# Patient Record
Sex: Female | Born: 1990 | Race: Black or African American | Hispanic: No | Marital: Single | Smoking: Current some day smoker
Health system: Southern US, Community
[De-identification: ages and names within clinical notes are randomized; demographics above are authoritative.]

## PROBLEM LIST (undated history)

## (undated) ENCOUNTER — Inpatient Hospital Stay (HOSPITAL_COMMUNITY): Payer: Self-pay

## (undated) DIAGNOSIS — F329 Major depressive disorder, single episode, unspecified: Secondary | ICD-10-CM

## (undated) DIAGNOSIS — F32A Depression, unspecified: Secondary | ICD-10-CM

## (undated) DIAGNOSIS — K219 Gastro-esophageal reflux disease without esophagitis: Secondary | ICD-10-CM

---

## 2018-01-16 ENCOUNTER — Other Ambulatory Visit: Payer: Self-pay

## 2018-01-16 ENCOUNTER — Encounter (HOSPITAL_COMMUNITY): Payer: Self-pay | Admitting: Emergency Medicine

## 2018-01-16 ENCOUNTER — Emergency Department (HOSPITAL_COMMUNITY)
Admission: EM | Admit: 2018-01-16 | Discharge: 2018-01-16 | Disposition: A | Discharge status: Home / Self Care | Payer: Medicaid Other | Attending: Emergency Medicine | Admitting: Emergency Medicine

## 2018-01-16 DIAGNOSIS — F17200 Nicotine dependence, unspecified, uncomplicated: Secondary | ICD-10-CM | POA: Diagnosis not present

## 2018-01-16 DIAGNOSIS — Z202 Contact with and (suspected) exposure to infections with a predominantly sexual mode of transmission: Secondary | ICD-10-CM | POA: Diagnosis not present

## 2018-01-16 DIAGNOSIS — N72 Inflammatory disease of cervix uteri: Secondary | ICD-10-CM | POA: Diagnosis not present

## 2018-01-16 DIAGNOSIS — N760 Acute vaginitis: Secondary | ICD-10-CM | POA: Diagnosis not present

## 2018-01-16 DIAGNOSIS — R300 Dysuria: Secondary | ICD-10-CM | POA: Diagnosis present

## 2018-01-16 DIAGNOSIS — B9689 Other specified bacterial agents as the cause of diseases classified elsewhere: Secondary | ICD-10-CM

## 2018-01-16 HISTORY — DX: Depression, unspecified: F32.A

## 2018-01-16 HISTORY — DX: Major depressive disorder, single episode, unspecified: F32.9

## 2018-01-16 HISTORY — DX: Gastro-esophageal reflux disease without esophagitis: K21.9

## 2018-01-16 LAB — CBC WITH DIFFERENTIAL/PLATELET
ABS IMMATURE GRANULOCYTES: 0.01 10*3/uL (ref 0.00–0.07)
Basophils Absolute: 0 10*3/uL (ref 0.0–0.1)
Basophils Relative: 0 %
Eosinophils Absolute: 0.2 10*3/uL (ref 0.0–0.5)
Eosinophils Relative: 3 %
HCT: 38 % (ref 36.0–46.0)
Hemoglobin: 11 g/dL — ABNORMAL LOW (ref 12.0–15.0)
Immature Granulocytes: 0 %
Lymphocytes Relative: 57 %
Lymphs Abs: 2.8 10*3/uL (ref 0.7–4.0)
MCH: 23.2 pg — ABNORMAL LOW (ref 26.0–34.0)
MCHC: 28.9 g/dL — ABNORMAL LOW (ref 30.0–36.0)
MCV: 80.2 fL (ref 80.0–100.0)
Monocytes Absolute: 0.3 10*3/uL (ref 0.1–1.0)
Monocytes Relative: 7 %
Neutro Abs: 1.6 10*3/uL — ABNORMAL LOW (ref 1.7–7.7)
Neutrophils Relative %: 33 %
Platelets: 206 10*3/uL (ref 150–400)
RBC: 4.74 MIL/uL (ref 3.87–5.11)
RDW: 13.2 % (ref 11.5–15.5)
WBC: 5 10*3/uL (ref 4.0–10.5)
nRBC: 0 % (ref 0.0–0.2)

## 2018-01-16 LAB — URINALYSIS, ROUTINE W REFLEX MICROSCOPIC
Bilirubin Urine: NEGATIVE
Glucose, UA: NEGATIVE mg/dL
Hgb urine dipstick: NEGATIVE
Ketones, ur: NEGATIVE mg/dL
Leukocytes, UA: NEGATIVE
Nitrite: NEGATIVE
Protein, ur: NEGATIVE mg/dL
Specific Gravity, Urine: 1.012 (ref 1.005–1.030)
pH: 7 (ref 5.0–8.0)

## 2018-01-16 LAB — COMPREHENSIVE METABOLIC PANEL
ALK PHOS: 33 U/L — AB (ref 38–126)
ALT: 11 U/L (ref 0–44)
AST: 15 U/L (ref 15–41)
Albumin: 3 g/dL — ABNORMAL LOW (ref 3.5–5.0)
Anion gap: 5 (ref 5–15)
CO2: 22 mmol/L (ref 22–32)
CREATININE: 0.67 mg/dL (ref 0.44–1.00)
Calcium: 8.5 mg/dL — ABNORMAL LOW (ref 8.9–10.3)
Chloride: 111 mmol/L (ref 98–111)
GFR calc Af Amer: >60 mL/min (ref >60)
GFR calc non Af Amer: >60 mL/min (ref >60)
Glucose, Bld: 83 mg/dL (ref 70–99)
Potassium: 3.8 mmol/L (ref 3.5–5.1)
Sodium: 138 mmol/L (ref 135–145)
Total Bilirubin: 0.3 mg/dL (ref 0.3–1.2)
Total Protein: 4.8 g/dL — ABNORMAL LOW (ref 6.5–8.1)

## 2018-01-16 LAB — I-STAT BETA HCG BLOOD, ED (MC, WL, AP ONLY)

## 2018-01-16 LAB — WET PREP, GENITAL
Sperm: NONE SEEN
Trich, Wet Prep: NONE SEEN
Yeast Wet Prep HPF POC: NONE SEEN

## 2018-01-16 MED ORDER — DOXYCYCLINE HYCLATE 100 MG PO CAPS: 100.0000 mg | ORAL_CAPSULE | Freq: Two times a day (BID) | ORAL | 0 refills | Status: AC

## 2018-01-16 MED ORDER — METRONIDAZOLE 500 MG PO TABS
2000.0000 mg | ORAL_TABLET | Freq: Once | ORAL | Status: AC
  Administered 2018-01-16: 2000 mg via ORAL
  Filled 2018-01-16: qty 4

## 2018-01-16 MED ORDER — AZITHROMYCIN 250 MG PO TABS
2000.0000 mg | ORAL_TABLET | Freq: Once | ORAL | Status: AC
  Administered 2018-01-16: 2000 mg via ORAL
  Filled 2018-01-16: qty 8

## 2018-01-16 MED ORDER — LIDOCAINE HCL (PF) 1 % IJ SOLN
INTRAMUSCULAR | Status: AC
  Administered 2018-01-16: 1 mL
  Filled 2018-01-16: qty 5

## 2018-01-16 MED ORDER — CEFTRIAXONE SODIUM 250 MG IJ SOLR
250.0000 mg | Freq: Once | INTRAMUSCULAR | Status: AC
  Administered 2018-01-16: 250 mg via INTRAMUSCULAR
  Filled 2018-01-16: qty 250

## 2018-01-16 NOTE — ED Provider Notes (Signed)
[facility] EMERGENCY DEPARTMENT Provider Note   CSN: [id number] Arrival date & time: 01/16/18  2024     History   Chief Complaint Chief Complaint  Patient presents with  . SEXUALLY TRANSMITTED DISEASE    HPI Lynn Rocha is a 28 y.o. female.  HPI   Patient is a 28 year old female with a past medical history of reflux and depression who presents for evaluation of 1 week of dysuria associated with crampy left lower quadrant abdominal pain.  Patient notes she was informed by her partner approximately 2 weeks ago that she was exposed through him to chlamydia.  She states she is also had some nonbloody diarrhea but denies any headache, earache, sore throat, chest pain, cough, vomiting, abdominal pain anywhere other than her left lower quadrant, rash, focal extremity numbness pain or tingling, or other acute complaints.  She denies prior similar episodes.  Denies alleviating or aggravating factors.  Past Medical History:  Diagnosis Date  . Acid reflux   . Depression     There are no active problems to display for this patient.   History reviewed. No pertinent surgical history.   OB History   No obstetric history on file.      Home Medications    Prior to Admission medications   Medication Sig Start Date End Date Taking? Authorizing Provider  doxycycline (VIBRAMYCIN) 100 MG capsule Take 1 capsule (100 mg total) by mouth 2 (two) times daily for 14 days. 01/16/18 01/30/18  Antoine Primas, MD    Family History No family history on file.  Social History Social History   Tobacco Use  . Smoking status: Current Every Day Smoker    Packs/day: 0.25  . Smokeless tobacco: Never Used  Substance Use Topics  . Alcohol use: Yes    Comment: occasionally  . Drug use: Never     Allergies   Patient has no known allergies.   Review of Systems Review of Systems  Constitutional: Negative for chills and fever.  HENT: Negative for ear pain and sore throat.     Eyes: Negative for pain and visual disturbance.  Respiratory: Negative for cough and shortness of breath.   Cardiovascular: Negative for chest pain and palpitations.  Gastrointestinal: Positive for abdominal pain and diarrhea. Negative for vomiting.  Genitourinary: Positive for dysuria. Negative for hematuria.  Musculoskeletal: Negative for arthralgias and back pain.  Skin: Negative for color change and rash.  Neurological: Negative for seizures and syncope.  All other systems reviewed and are negative.    Physical Exam Updated Vital Signs BP 109/79   Pulse 67   Temp 98.4 F (36.9 C) (Oral)   Resp 18   LMP 01/05/2018   SpO2 100%   Physical Exam Vitals signs and nursing note reviewed. Exam conducted with a chaperone present.  Constitutional:      General: She is not in acute distress.    Appearance: She is well-developed.  HENT:     Head: Normocephalic and atraumatic.     Right Ear: External ear normal.     Left Ear: External ear normal.     Nose: Nose normal.     Mouth/Throat:     Mouth: Mucous membranes are moist.  Eyes:     Extraocular Movements: Extraocular movements intact.     Conjunctiva/sclera: Conjunctivae normal.     Pupils: Pupils are equal, round, and reactive to light.  Neck:     Musculoskeletal: Neck supple.  Cardiovascular:     Rate and Rhythm:  Normal rate and regular rhythm.     Pulses: Normal pulses.     Heart sounds: No murmur.  Pulmonary:     Effort: Pulmonary effort is normal. No respiratory distress.     Breath sounds: Normal breath sounds.  Abdominal:     Palpations: Abdomen is soft.     Tenderness: There is abdominal tenderness in the left lower quadrant. There is no right CVA tenderness or left CVA tenderness.  Genitourinary:    Cervix: Discharge and erythema present. No cervical motion tenderness or cervical bleeding.     Adnexa:        Right: No mass, tenderness or fullness.         Left: No mass, tenderness or fullness.    Skin:     General: Skin is warm and dry.     Capillary Refill: Capillary refill takes less than 2 seconds.  Neurological:     General: No focal deficit present.     Mental Status: She is alert.      ED Treatments / Results  Labs (all labs ordered are listed, but only abnormal results are displayed) Labs Reviewed  WET PREP, GENITAL - Abnormal; Notable for the following components:      Result Value   Clue Cells Wet Prep HPF POC PRESENT (*)    WBC, Wet Prep HPF POC MANY (*)    All other components within normal limits  CBC WITH DIFFERENTIAL/PLATELET - Abnormal; Notable for the following components:   Hemoglobin 11.0 (*)    MCH 23.2 (*)    MCHC 28.9 (*)    Neutro Abs 1.6 (*)    All other components within normal limits  COMPREHENSIVE METABOLIC PANEL - Abnormal; Notable for the following components:   BUN <5 (*)    Calcium 8.5 (*)    Total Protein 4.8 (*)    Albumin 3.0 (*)    Alkaline Phosphatase 33 (*)    All other components within normal limits  URINE CULTURE  URINALYSIS, ROUTINE W REFLEX MICROSCOPIC  HIV ANTIBODY (ROUTINE TESTING W REFLEX)  HEPATITIS PANEL, ACUTE  I-STAT BETA HCG BLOOD, ED ([facility], [facility], [facility] ONLY)  GC/CHLAMYDIA PROBE AMP ([place]) NOT AT [facility]    EKG None  Radiology No results found.  Procedures Procedures (including critical care time)  Medications Ordered in ED Medications  cefTRIAXone (ROCEPHIN) injection 250 mg (250 mg Intramuscular Given 01/16/18 2117)  azithromycin (ZITHROMAX) tablet 2,000 mg (2,000 mg Oral Given 01/16/18 2117)  lidocaine (PF) (XYLOCAINE) 1 % injection (1 mL  Given 01/16/18 2117)  metroNIDAZOLE (FLAGYL) tablet 2,000 mg (2,000 mg Oral Given 01/16/18 2223)     Initial Impression / Assessment and Plan / ED Course  I have reviewed the triage vital signs and the nursing notes.  Pertinent labs & imaging results that were available during my care of the patient were reviewed by me and considered in my medical decision making (see chart for  details).     Patient is a 28 year old female who presents with above-stated history exam.  On presentation patient is afebrile stable vital signs.  Exam as above remarkable for left lower quadrant Donnell tenderness as well as cervical erythema with purulent discharge.  Patient has no adnexal tenderness on pelvic exam.  Patient was empirically treated with Rocephin and azithromycin.  Wet prep shows clue cells with many WBCs.  Patient empirically treated with Flagyl.  CMP shows normal electrolytes, unremarkable LFTs, AG of 5.  CBC shows a WBC of 5, hemoglobin  11, platelets 206.  Beta hCG undetectable.  Patient elected to leave prior to results of UA.  Hepatitis and HIV studies were sent.  Urine culture was sent.  Patient discharged with a prescription for doxycycline.  History exam is not consistent with appendicitis, TOA, diverticulitis, ovarian torsion, acute pancreatitis, or other life-threatening intra-abdominal pathology.  Patient discharged in stable condition.  Strict return precautions advised and discussed.  Instructed to follow-up with PCP in 3 to 5 days.  Final Clinical Impressions(s) / ED Diagnoses   Final diagnoses:  STD exposure  Cervicitis  Bacterial vaginosis    ED Discharge Orders         Ordered    doxycycline (VIBRAMYCIN) 100 MG capsule  2 times daily     01/16/18 2236           Antoine PrimasSmith, Lofton Leon, MD 01/16/18 2302    Jacalyn LefevreHaviland, Julie, MD 01/19/18 [phone]

## 2018-01-16 NOTE — ED Triage Notes (Addendum)
Pt reports a partner informed her that they were positive for gonorrhea and chlamydia. Pt reports burning with urination and lower abdominal pain x 1 week. Pt denies vaginal discharge or bleeding

## 2018-01-17 LAB — HIV ANTIBODY (ROUTINE TESTING W REFLEX): HIV Screen 4th Generation wRfx: NONREACTIVE

## 2018-01-18 LAB — HEPATITIS PANEL, ACUTE
HCV Ab: <0.1 s/co ratio (ref 0.0–0.9)
Hep A IgM: NEGATIVE
Hep B C IgM: NEGATIVE
Hepatitis B Surface Ag: NEGATIVE

## 2018-01-18 LAB — URINE CULTURE

## 2018-01-18 LAB — GC/CHLAMYDIA PROBE AMP (~~LOC~~) NOT AT ARMC
Chlamydia: POSITIVE — AB
Neisseria Gonorrhea: POSITIVE — AB

## 2018-04-10 ENCOUNTER — Inpatient Hospital Stay (HOSPITAL_COMMUNITY)
Admission: EM | Admit: 2018-04-10 | Discharge: 2018-04-10 | Disposition: A | Discharge status: Home / Self Care | Payer: Medicaid Other | Attending: Obstetrics & Gynecology | Admitting: Obstetrics & Gynecology

## 2018-04-10 ENCOUNTER — Other Ambulatory Visit: Payer: Self-pay

## 2018-04-10 ENCOUNTER — Inpatient Hospital Stay (HOSPITAL_COMMUNITY): Payer: Medicaid Other

## 2018-04-10 ENCOUNTER — Encounter (HOSPITAL_COMMUNITY): Payer: Self-pay

## 2018-04-10 DIAGNOSIS — F329 Major depressive disorder, single episode, unspecified: Secondary | ICD-10-CM | POA: Insufficient documentation

## 2018-04-10 DIAGNOSIS — O99611 Diseases of the digestive system complicating pregnancy, first trimester: Secondary | ICD-10-CM | POA: Diagnosis not present

## 2018-04-10 DIAGNOSIS — R6883 Chills (without fever): Secondary | ICD-10-CM | POA: Diagnosis not present

## 2018-04-10 DIAGNOSIS — R109 Unspecified abdominal pain: Secondary | ICD-10-CM

## 2018-04-10 DIAGNOSIS — O99331 Smoking (tobacco) complicating pregnancy, first trimester: Secondary | ICD-10-CM | POA: Insufficient documentation

## 2018-04-10 DIAGNOSIS — O219 Vomiting of pregnancy, unspecified: Secondary | ICD-10-CM | POA: Insufficient documentation

## 2018-04-10 DIAGNOSIS — O23591 Infection of other part of genital tract in pregnancy, first trimester: Secondary | ICD-10-CM | POA: Diagnosis not present

## 2018-04-10 DIAGNOSIS — O26891 Other specified pregnancy related conditions, first trimester: Secondary | ICD-10-CM | POA: Diagnosis not present

## 2018-04-10 DIAGNOSIS — K219 Gastro-esophageal reflux disease without esophagitis: Secondary | ICD-10-CM | POA: Insufficient documentation

## 2018-04-10 DIAGNOSIS — Z3A01 Less than 8 weeks gestation of pregnancy: Secondary | ICD-10-CM

## 2018-04-10 DIAGNOSIS — O111 Pre-existing hypertension with pre-eclampsia, first trimester: Secondary | ICD-10-CM

## 2018-04-10 DIAGNOSIS — O99341 Other mental disorders complicating pregnancy, first trimester: Secondary | ICD-10-CM | POA: Diagnosis not present

## 2018-04-10 DIAGNOSIS — N760 Acute vaginitis: Secondary | ICD-10-CM

## 2018-04-10 DIAGNOSIS — R1012 Left upper quadrant pain: Secondary | ICD-10-CM | POA: Insufficient documentation

## 2018-04-10 DIAGNOSIS — F17210 Nicotine dependence, cigarettes, uncomplicated: Secondary | ICD-10-CM | POA: Insufficient documentation

## 2018-04-10 DIAGNOSIS — O9989 Other specified diseases and conditions complicating pregnancy, childbirth and the puerperium: Secondary | ICD-10-CM | POA: Insufficient documentation

## 2018-04-10 DIAGNOSIS — B9689 Other specified bacterial agents as the cause of diseases classified elsewhere: Secondary | ICD-10-CM

## 2018-04-10 DIAGNOSIS — R112 Nausea with vomiting, unspecified: Secondary | ICD-10-CM

## 2018-04-10 DIAGNOSIS — Z3491 Encounter for supervision of normal pregnancy, unspecified, first trimester: Secondary | ICD-10-CM

## 2018-04-10 LAB — COMPREHENSIVE METABOLIC PANEL
ALT: 18 U/L (ref 0–44)
AST: 22 U/L (ref 15–41)
Albumin: 4.2 g/dL (ref 3.5–5.0)
Alkaline Phosphatase: 46 U/L (ref 38–126)
Anion gap: 11 (ref 5–15)
BUN: 9 mg/dL (ref 6–20)
CO2: 20 mmol/L — ABNORMAL LOW (ref 22–32)
Calcium: 9.4 mg/dL (ref 8.9–10.3)
Chloride: 106 mmol/L (ref 98–111)
Creatinine, Ser: 0.57 mg/dL (ref 0.44–1.00)
GFR calc Af Amer: >60 mL/min (ref >60)
GFR calc non Af Amer: >60 mL/min (ref >60)
Glucose, Bld: 88 mg/dL (ref 70–99)
Potassium: 3.3 mmol/L — ABNORMAL LOW (ref 3.5–5.1)
Sodium: 137 mmol/L (ref 135–145)
Total Bilirubin: 0.7 mg/dL (ref 0.3–1.2)
Total Protein: 6.9 g/dL (ref 6.5–8.1)

## 2018-04-10 LAB — URINALYSIS, ROUTINE W REFLEX MICROSCOPIC
Bacteria, UA: NONE SEEN
Bilirubin Urine: NEGATIVE
Glucose, UA: NEGATIVE mg/dL
Hgb urine dipstick: NEGATIVE
Ketones, ur: 80 mg/dL — AB
Leukocytes,Ua: NEGATIVE
Nitrite: NEGATIVE
Protein, ur: 30 mg/dL — AB
Specific Gravity, Urine: 1.031 — ABNORMAL HIGH (ref 1.005–1.030)
pH: 5 (ref 5.0–8.0)

## 2018-04-10 LAB — WET PREP, GENITAL
Sperm: NONE SEEN
Trich, Wet Prep: NONE SEEN
Yeast Wet Prep HPF POC: NONE SEEN

## 2018-04-10 LAB — CBC WITH DIFFERENTIAL/PLATELET
Abs Immature Granulocytes: 0 10*3/uL (ref 0.00–0.07)
Basophils Absolute: 0 10*3/uL (ref 0.0–0.1)
Basophils Relative: 0 %
Eosinophils Absolute: 0 10*3/uL (ref 0.0–0.5)
Eosinophils Relative: 0 %
HCT: 38.6 % (ref 36.0–46.0)
Hemoglobin: 12.2 g/dL (ref 12.0–15.0)
Immature Granulocytes: 0 %
Lymphocytes Relative: 34 %
Lymphs Abs: 1.7 10*3/uL (ref 0.7–4.0)
MCH: 23.6 pg — ABNORMAL LOW (ref 26.0–34.0)
MCHC: 31.6 g/dL (ref 30.0–36.0)
MCV: 74.8 fL — ABNORMAL LOW (ref 80.0–100.0)
Monocytes Absolute: 0.5 10*3/uL (ref 0.1–1.0)
Monocytes Relative: 10 %
Neutro Abs: 2.6 10*3/uL (ref 1.7–7.7)
Neutrophils Relative %: 56 %
Platelets: 245 10*3/uL (ref 150–400)
RBC: 5.16 MIL/uL — ABNORMAL HIGH (ref 3.87–5.11)
RDW: 12.6 % (ref 11.5–15.5)
WBC: 4.8 10*3/uL (ref 4.0–10.5)
nRBC: 0 % (ref 0.0–0.2)

## 2018-04-10 LAB — HCG, QUANTITATIVE, PREGNANCY: hCG, Beta Chain, Quant, S: 55224 m[IU]/mL — ABNORMAL HIGH (ref <5)

## 2018-04-10 LAB — I-STAT BETA HCG BLOOD, ED (MC, WL, AP ONLY): I-stat hCG, quantitative: >2000 m[IU]/mL — ABNORMAL HIGH (ref <5)

## 2018-04-10 LAB — LIPASE, BLOOD: Lipase: 24 U/L (ref 11–51)

## 2018-04-10 IMAGING — US OBSTETRIC <14 WK US AND TRANSVAGINAL OB US
1 series · 15 of 28 positions shown · non-contrast
Comparison: None.

CLINICAL DATA: Abdominal pain. Quantitative beta HCG [DATE].
Estimated gestational age 6 weeks 2 days per LMP.

EXAM:
OBSTETRIC <14 WK US AND TRANSVAGINAL OB US
TECHNIQUE: Both transabdominal and transvaginal ultrasound examinations were
performed for complete evaluation of the gestation as well as the
maternal uterus, adnexal regions, and pelvic cul-de-sac.
Transvaginal technique was performed to assess early pregnancy.

[Series 1: obstetric <14 wk us and transvaginal ob us · 73 acquisitions, 15 frames shown]
[im 1/73]
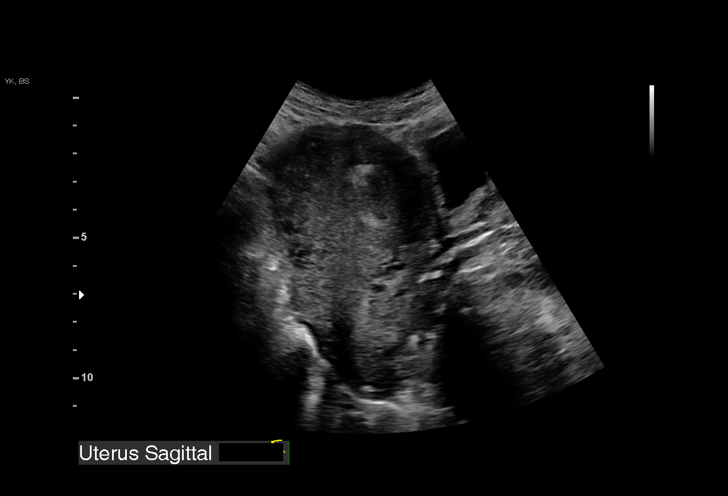
[im 6/73]
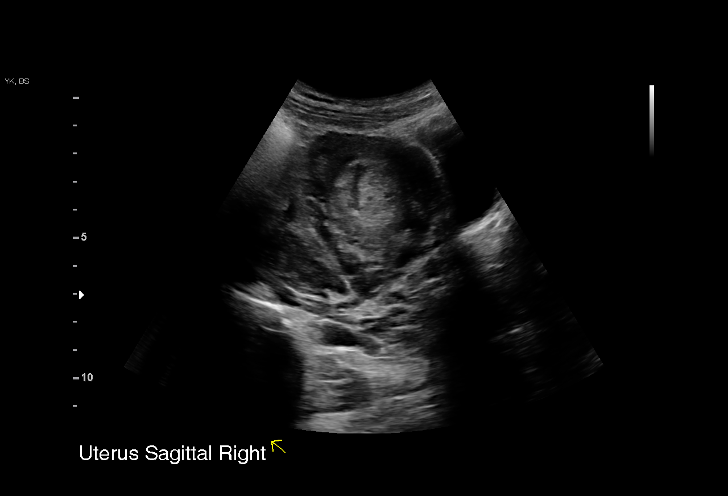
[im 11/73]
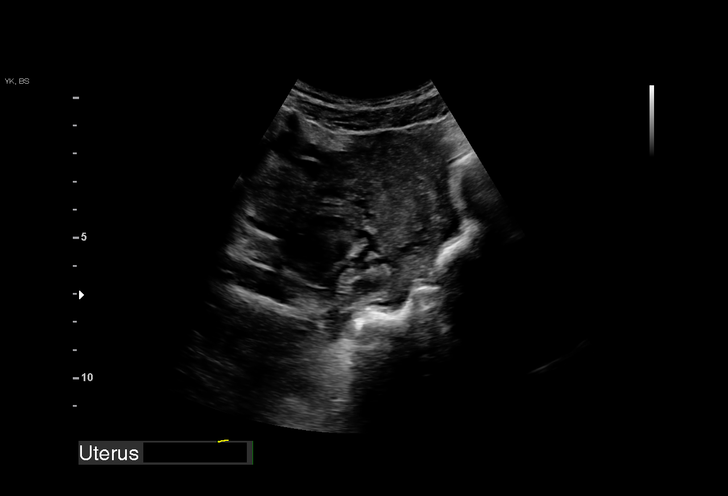
[im 17/73]
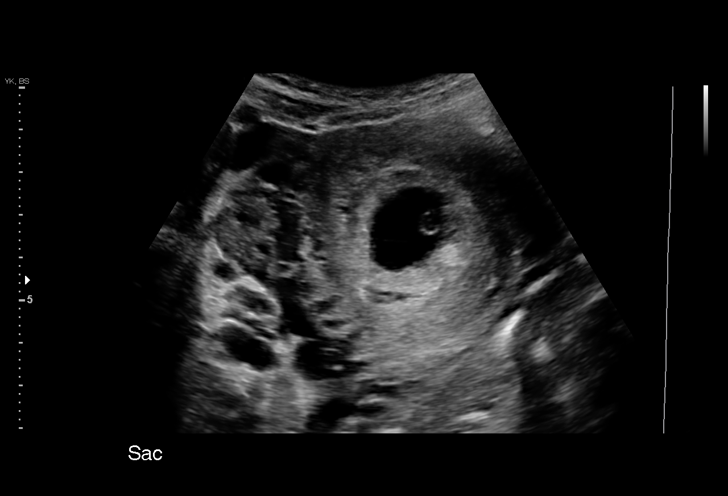
[im 22/73]
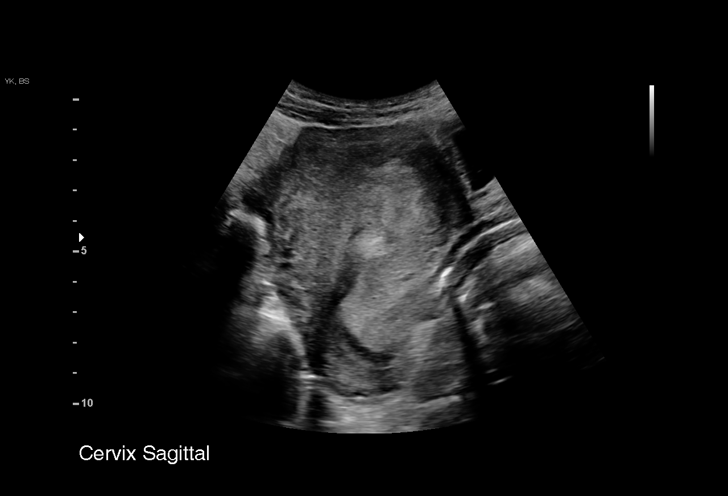
[im 27/73]
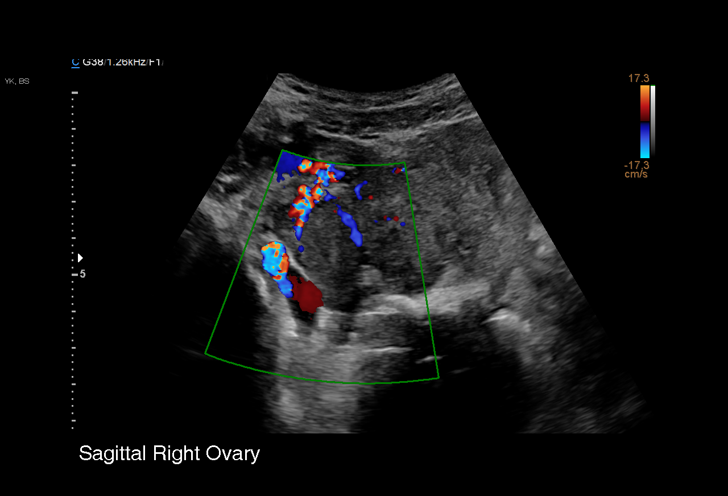
[im 33/73]
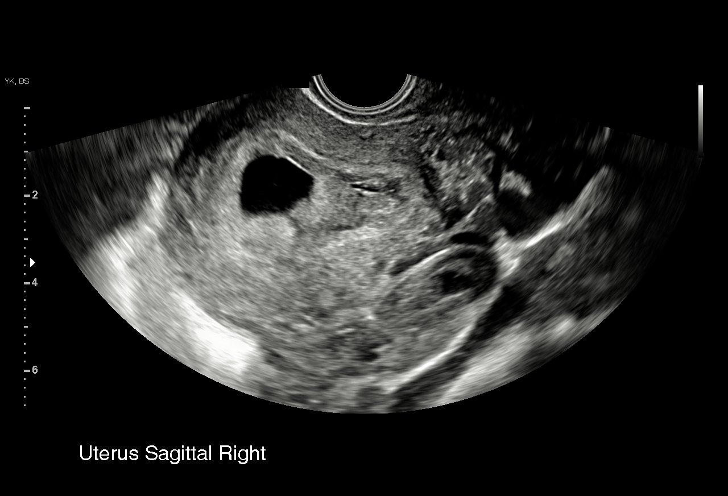
[im 38/73]
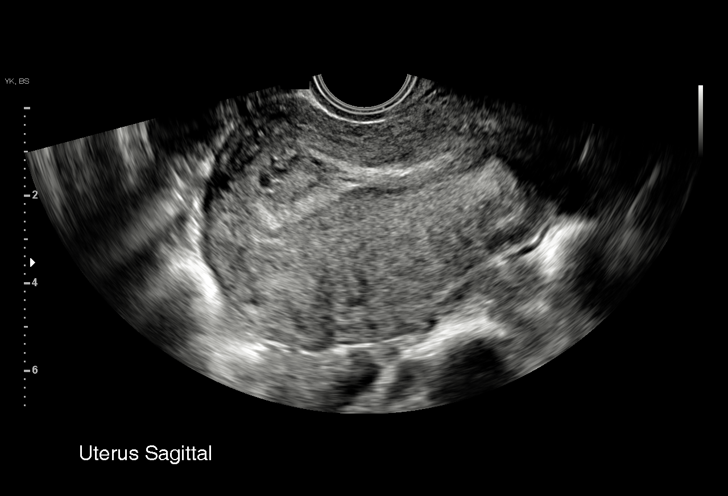
[im 41/73]
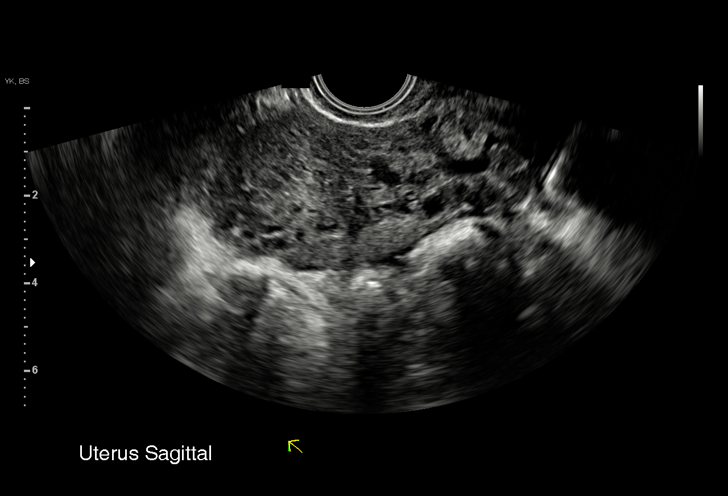
[im 46/73]
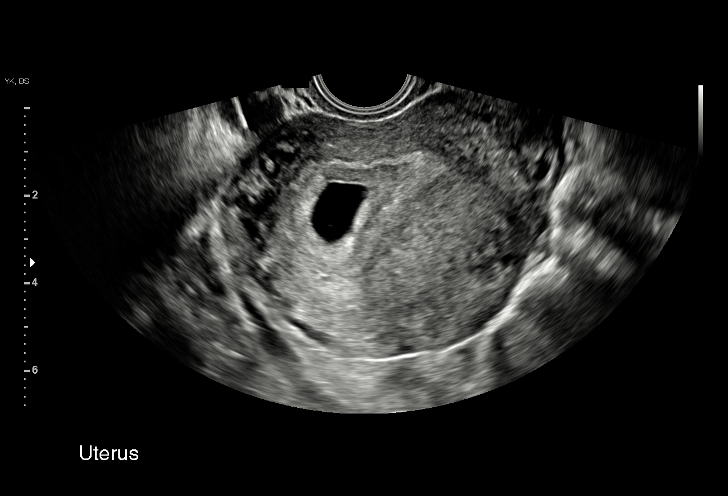
[im 51/73]
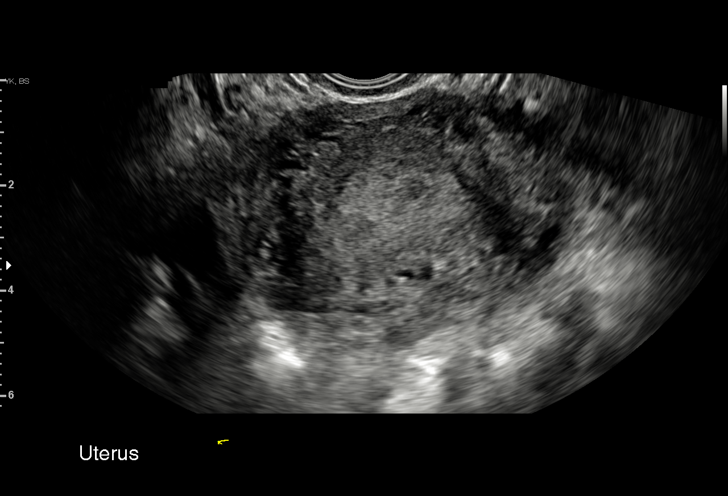
[im 57/73]
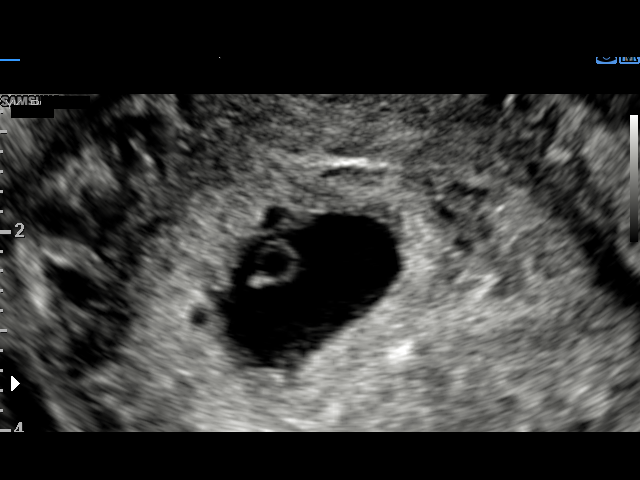
[im 62/73]
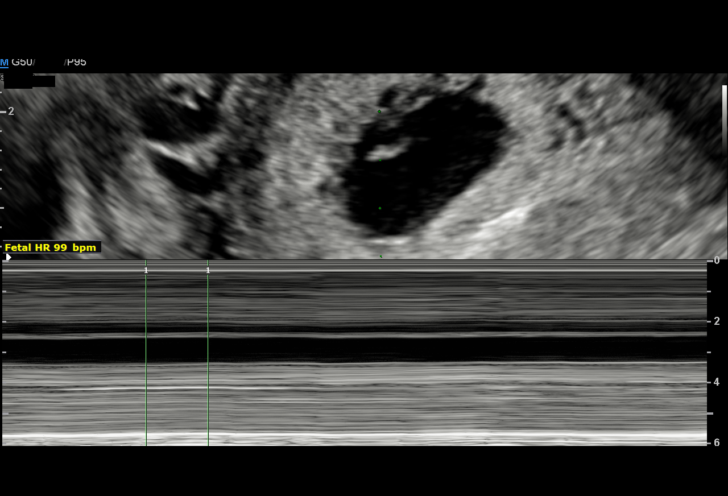
[im 67/73]
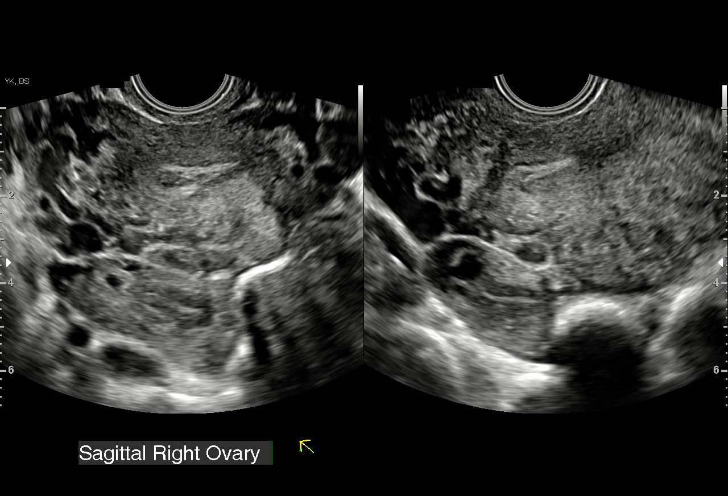
[im 73/73]
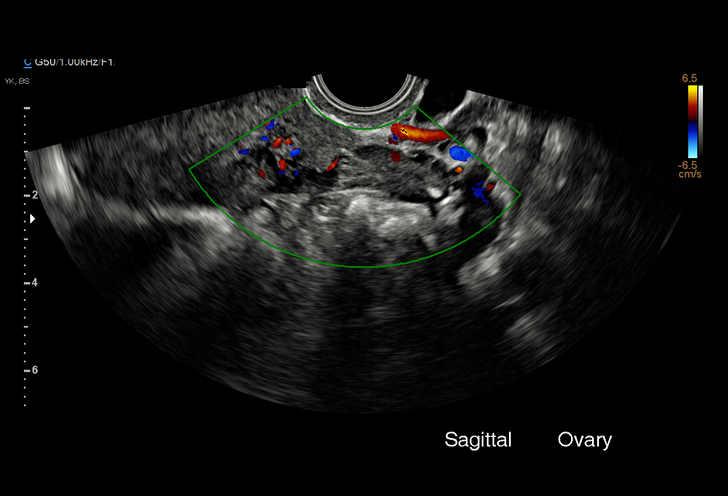

[15 of 28 positions shown; findings below may reference images not displayed]

FINDINGS: Intrauterine gestational sac: Single visualized.

Yolk sac:  Visualized.

Embryo:  Visualized.

Cardiac Activity: Visualized.

Heart Rate: 100 bpm

MSD: 21.48 mm   7 w   0 d

CRL:  2.43 mm   5 w   5 d                  US EDC: 12/06/2018

Subchorionic hemorrhage:  None visualized.

Maternal uterus/adnexae: Ovaries are normal size, shape and position
with normal color Doppler flow. No free pelvic fluid.
IMPRESSION: Single live IUP with estimated gestational age 5 weeks 5 days.

## 2018-04-10 MED ORDER — SODIUM CHLORIDE 0.9 % IV BOLUS
1000.0000 mL | Freq: Once | INTRAVENOUS | Status: AC
  Administered 2018-04-10: 1000 mL via INTRAVENOUS

## 2018-04-10 MED ORDER — LACTATED RINGERS IV BOLUS
1000.0000 mL | Freq: Once | INTRAVENOUS | Status: AC
  Administered 2018-04-10: 1000 mL via INTRAVENOUS

## 2018-04-10 MED ORDER — METOCLOPRAMIDE HCL 5 MG/ML IJ SOLN
10.0000 mg | Freq: Once | INTRAMUSCULAR | Status: AC
  Administered 2018-04-10: 10 mg via INTRAVENOUS
  Filled 2018-04-10: qty 2

## 2018-04-10 MED ORDER — DIPHENHYDRAMINE HCL 50 MG/ML IJ SOLN
12.5000 mg | Freq: Once | INTRAMUSCULAR | Status: AC
  Administered 2018-04-10: 12.5 mg via INTRAVENOUS
  Filled 2018-04-10: qty 1

## 2018-04-10 MED ORDER — ONDANSETRON HCL 4 MG/2ML IJ SOLN
4.0000 mg | Freq: Once | INTRAMUSCULAR | Status: AC
  Administered 2018-04-10: 4 mg via INTRAVENOUS
  Filled 2018-04-10: qty 2

## 2018-04-10 MED ORDER — PROMETHAZINE HCL 25 MG PO TABS: 12.5000 mg | ORAL_TABLET | Freq: Four times a day (QID) | ORAL | 5 refills | Status: DC | PRN

## 2018-04-10 MED ORDER — POTASSIUM CHLORIDE CRYS ER 20 MEQ PO TBCR: 40.0000 meq | EXTENDED_RELEASE_TABLET | Freq: Once | ORAL | Status: DC

## 2018-04-10 MED ORDER — METRONIDAZOLE 500 MG PO TABS: 500.0000 mg | ORAL_TABLET | Freq: Two times a day (BID) | ORAL | 0 refills | Status: DC

## 2018-04-10 MED ORDER — FAMOTIDINE 20 MG IN NS 100 ML IVPB
20.0000 mg | Freq: Once | INTRAVENOUS | Status: AC
  Administered 2018-04-10: 20 mg via INTRAVENOUS
  Filled 2018-04-10: qty 100

## 2018-04-10 NOTE — MAU Note (Signed)
Pt reports emesis for past 4-5 days, with lower right abd pain as well as upper left abd pain. Pt denies vag bleeding.

## 2018-04-10 NOTE — ED Triage Notes (Signed)
Pt reports emesis, LLQ pain for the past 3-4 days. Pt found out Monday that she is pregnant. Last BM 2 days ago. Urinary urgency, white vaginal discharge. Denies fever but having chills.  LMP feb 25th

## 2018-04-10 NOTE — Discharge Instructions (Signed)
[place] [facility] Providers    [facility]       Phone: [phone]  [facility]   Phone: [phone]  [facility] [place]  Phone: [phone]  [facility] [place]  Phone: [phone]  [facility] [place]  Phone: [phone]  [facility]   Phone: [phone]  [facility]       Phone: [phone]  [facility]    Phone: [phone]   [facility]    Phone: [phone]  [facility]    Phone: [phone]  [facility]    Phone: [phone]  [facility]       Phone: [phone]   [facility]  Phone: [phone]  [facility]    Phone: [phone]  [facility]   Phone: [phone]  [facility]      Phone: [phone]  [facility]    Phone: [phone]   

## 2018-04-10 NOTE — MAU Provider Note (Addendum)
Chief Complaint: Emesis and Abdominal Pain   None     SUBJECTIVE HPI: Lynn Rocha is a 28 y.o. G4P3 at [redacted]w[redacted]d by LMP who presents to maternity admissions reporting nausea with vomiting 9-10 times in 24 hours and right lower quadrant abdominal pain with positive home pregnancy test. She reports n/v with previous pregnancies but never this much. She has not tried any treatments other than diet changes, which are not helping. The vomiting is associated with pain in her right lower abdomen that is intermittent and sharp and burning pain in her left upper quadrant. She has thick white vaginal discharge.  She reports chills today with the vomiting.  She has no respiratory or other symptoms. She denies vaginal bleeding, vaginal itching/burning, urinary symptoms, h/a, dizziness, or fever.     HPI  Past Medical History:  Diagnosis Date  . Acid reflux   . Depression    History reviewed. No pertinent surgical history. Social History   Socioeconomic History  . Marital status: Single    Spouse name: Not on file  . Number of children: Not on file  . Years of education: Not on file  . Highest education level: Not on file  Occupational History  . Not on file  Social Needs  . Financial resource strain: Not on file  . Food insecurity:    Worry: Not on file    Inability: Not on file  . Transportation needs:    Medical: Not on file    Non-medical: Not on file  Tobacco Use  . Smoking status: Current Every Day Smoker    Packs/day: 0.25  . Smokeless tobacco: Never Used  Substance and Sexual Activity  . Alcohol use: Yes    Comment: occasionally  . Drug use: Never  . Sexual activity: Yes  Lifestyle  . Physical activity:    Days per week: Not on file    Minutes per session: Not on file  . Stress: Not on file  Relationships  . Social connections:    Talks on phone: Not on file    Gets together: Not on file    Attends religious service: Not on file    Active member of club or organization:  Not on file    Attends meetings of clubs or organizations: Not on file    Relationship status: Not on file  . Intimate partner violence:    Fear of current or ex partner: Not on file    Emotionally abused: Not on file    Physically abused: Not on file    Forced sexual activity: Not on file  Other Topics Concern  . Not on file  Social History Narrative  . Not on file   No current facility-administered medications on file prior to encounter.    No current outpatient medications on file prior to encounter.   No Known Allergies  ROS:  Review of Systems  Constitutional: Negative for chills, fatigue and fever.  HENT: Negative for sinus pressure.   Eyes: Negative for photophobia.  Respiratory: Negative for shortness of breath.   Cardiovascular: Negative for chest pain.  Gastrointestinal: Positive for abdominal pain, nausea and vomiting.  Genitourinary: Positive for pelvic pain. Negative for difficulty urinating, dysuria, flank pain, frequency, vaginal bleeding, vaginal discharge and vaginal pain.  Musculoskeletal: Negative for neck pain.  Neurological: Negative for dizziness, weakness and headaches.  Psychiatric/Behavioral: Negative.      I have reviewed patient's Past Medical Hx, Surgical Hx, Family Hx, Social Hx, medications and allergies.  Physical Exam   Patient Vitals for the past 24 hrs:  BP Temp Temp src Pulse Resp SpO2 Height Weight  04/10/18 1549 118/69 98.3 F (36.8 C) - 70 18 100 % 5\' 8"  (1.727 m) 58.6 kg  04/10/18 1430 111/70 - - 68 (!) 23 100 % - -  04/10/18 1400 102/71 - - 75 19 100 % - -  04/10/18 1341 121/77 98.8 F (37.1 C) Oral 83 14 100 % - -  04/10/18 1327 - 98.6 F (37 C) Oral - - - - -   Constitutional: Well-developed, well-nourished female in no acute distress.  Cardiovascular: normal rate Respiratory: normal effort GI: Abd soft, non-tender. Pos BS x 4 MS: Extremities nontender, no edema, normal ROM Neurologic: Alert and oriented x 4.  GU: Neg  CVAT.  PELVIC EXAM: Wet prep/GC collected by blind swab   LAB RESULTS Results for orders placed or performed during the hospital encounter of 04/10/18 (from the past 24 hour(s))  Comprehensive metabolic panel     Status: Abnormal   Collection Time: 04/10/18  2:01 PM  Result Value Ref Range   Sodium 137 135 - 145 mmol/L   Potassium 3.3 (L) 3.5 - 5.1 mmol/L   Chloride 106 98 - 111 mmol/L   CO2 20 (L) 22 - 32 mmol/L   Glucose, Bld 88 70 - 99 mg/dL   BUN 9 6 - 20 mg/dL   Creatinine, Ser 9.15 0.44 - 1.00 mg/dL   Calcium 9.4 8.9 - 05.6 mg/dL   Total Protein 6.9 6.5 - 8.1 g/dL   Albumin 4.2 3.5 - 5.0 g/dL   AST 22 15 - 41 U/L   ALT 18 0 - 44 U/L   Alkaline Phosphatase 46 38 - 126 U/L   Total Bilirubin 0.7 0.3 - 1.2 mg/dL   GFR calc non Af Amer >60 >60 mL/min   GFR calc Af Amer >60 >60 mL/min   Anion gap 11 5 - 15  CBC with Differential     Status: Abnormal   Collection Time: 04/10/18  2:01 PM  Result Value Ref Range   WBC 4.8 4.0 - 10.5 K/uL   RBC 5.16 (H) 3.87 - 5.11 MIL/uL   Hemoglobin 12.2 12.0 - 15.0 g/dL   HCT 97.9 48.0 - 16.5 %   MCV 74.8 (L) 80.0 - 100.0 fL   MCH 23.6 (L) 26.0 - 34.0 pg   MCHC 31.6 30.0 - 36.0 g/dL   RDW 53.7 48.2 - 70.7 %   Platelets 245 150 - 400 K/uL   nRBC 0.0 0.0 - 0.2 %   Neutrophils Relative % 56 %   Neutro Abs 2.6 1.7 - 7.7 K/uL   Lymphocytes Relative 34 %   Lymphs Abs 1.7 0.7 - 4.0 K/uL   Monocytes Relative 10 %   Monocytes Absolute 0.5 0.1 - 1.0 K/uL   Eosinophils Relative 0 %   Eosinophils Absolute 0.0 0.0 - 0.5 K/uL   Basophils Relative 0 %   Basophils Absolute 0.0 0.0 - 0.1 K/uL   Immature Granulocytes 0 %   Abs Immature Granulocytes 0.00 0.00 - 0.07 K/uL  Lipase, blood     Status: None   Collection Time: 04/10/18  2:01 PM  Result Value Ref Range   Lipase 24 11 - 51 U/L  hCG, quantitative, pregnancy     Status: Abnormal   Collection Time: 04/10/18  2:01 PM  Result Value Ref Range   hCG, Beta Chain, Quant, S 55,224 (H) <5 mIU/mL  I-Stat Beta hCG blood, ED ([facility], [facility], [facility] only)     Status: Abnormal   Collection Time: 04/10/18  2:18 PM  Result Value Ref Range   I-stat hCG, quantitative >2,000.0 (H) <5 mIU/mL   Comment 3          Wet prep, genital     Status: Abnormal   Collection Time: 04/10/18  4:33 PM  Result Value Ref Range   Yeast Wet Prep HPF POC NONE SEEN NONE SEEN   Trich, Wet Prep NONE SEEN NONE SEEN   Clue Cells Wet Prep HPF POC PRESENT (A) NONE SEEN   WBC, Wet Prep HPF POC MANY (A) NONE SEEN   Sperm NONE SEEN        IMAGING US Ob Less Than 14 Weeks With Ob Transvaginal  Result Date: 04/10/2018 CLINICAL DATA:  Abdominal pain. Quantitative beta HCG 55,224. Estimated gestational age [redacted] weeks 2 days per LMP. EXAM: OBSTETRIC <14 WK [place] AND TRANSVAGINAL OB US TECHNIQUE: Both transabdominal and transvaginal ultrasound examinations were performed for complete evaluation of the gestation as well as the maternal uterus, adnexal regions, and pelvic cul-de-sac. Transvaginal technique was performed to assess early pregnancy. COMPARISON:  None. FINDINGS: Intrauterine gestational sac: Single visualized. Yolk sac:  Visualized. Embryo:  Visualized. Cardiac Activity: Visualized. Heart Rate: 100 bpm MSD: 21.48 mm   7 w   0 d CRL:  2.43 mm   5 w   5 d                  [place] EDC: 12/06/2018 Subchorionic hemorrhage:  None visualized. Maternal uterus/adnexae: Ovaries are normal size, shape and position with normal color Doppler flow. No free pelvic fluid. IMPRESSION: Single live IUP with estimated gestational age [redacted] weeks 5 days. Electronically Signed   By: Elberta Fortis M.D.   On: 04/10/2018 17:32    MAU Management/MDM: Orders Placed This Encounter  Procedures  . Wet prep, genital  . Urine culture  . US OB LESS THAN 14 WEEKS WITH OB TRANSVAGINAL  . Comprehensive metabolic panel  . CBC with Differential  . RPR  . HIV antibody  . Urinalysis, Routine w reflex microscopic  . Lipase, blood  . hCG, quantitative, pregnancy  . Pelvic  cart  . Cardiac monitoring  . I-Stat Beta hCG blood, ED ([facility], [facility], [facility] only)  . Saline lock IV  . Discharge patient    Meds ordered this encounter  Medications  . sodium chloride 0.9 % bolus 1,000 mL  . metoCLOPramide (REGLAN) injection 10 mg  . diphenhydrAMINE (BENADRYL) injection 12.5 mg  . diphenhydrAMINE (BENADRYL) injection 12.5 mg  . DISCONTD: potassium chloride SA (K-DUR,KLOR-CON) CR tablet 40 mEq  . famotidine (PEPCID) IVPB 20 mg in NS 100 mL IVPB  . lactated ringers bolus 1,000 mL  . ondansetron (ZOFRAN) injection 4 mg  . promethazine (PHENERGAN) 25 MG tablet    Sig: Take 0.5-1 tablets (12.5-25 mg total) by mouth every 6 (six) hours as needed for nausea.    Dispense:  30 tablet    Refill:  5    Order Specific Question:   Supervising Provider    Answer:   Adam Phenix [[id number]]  . metroNIDAZOLE (FLAGYL) 500 MG tablet    Sig: Take 1 tablet (500 mg total) by mouth 2 (two) times daily.    Dispense:  14 tablet    Refill:  0    Order Specific Question:   Supervising Provider    Answer:   Scheryl Darter  G [[id number]]    Pt initially with improved nausea after IV fluids, Reglan, and Benadryl in ED.  Symptoms returned while waiting for wet prep so IV Zofran and Pepcid given. SymptomsK[place] improved.  US indicates IUP today, UA is pending.  No acute abdomen on exam.  Wet prep with clue cells and pt with symptoms so will treat BV.  Pt to wait until nausea improves to begin Flagyl.  D/C home with Rx for Phenergan, pt to f/u with OB/Gyn provider of her choice, list provided.  Reasons to seek emergency care reviewed.    ASSESSMENT 1. Non-intractable vomiting with nausea, unspecified vomiting type   2. First trimester pregnancy   3. Abdominal pain during pregnancy in first trimester   4. Normal IUP (intrauterine pregnancy) on prenatal ultrasound, first trimester   5. Nausea and vomiting during pregnancy prior to [redacted] weeks gestation   6. Bacterial vaginosis     PLAN Discharge home Allergies as of  04/10/2018   No Known Allergies     Medication List    TAKE these medications   metroNIDAZOLE 500 MG tablet Commonly known as:  FLAGYL Take 1 tablet (500 mg total) by mouth 2 (two) times daily.   promethazine 25 MG tablet Commonly known as:  PHENERGAN Take 0.5-1 tablets (12.5-25 mg total) by mouth every 6 (six) hours as needed for nausea.      Follow-up Information    Ob/Gyn provider of your choice Follow up.   Why:  See list provided       [facility] Follow up.   Why:  For emergencies Contact information: [address] [place] [phone]          Sharen Counter Certified Nurse-Midwife 04/10/2018  7:34 PM

## 2018-04-10 NOTE — ED Provider Notes (Signed)
[facility] EMERGENCY DEPARTMENT Provider Note   CSN: [id number] Arrival date & time: 04/10/18  1326    History   Chief Complaint Chief Complaint  Patient presents with  . Emesis  . Abdominal Pain    HPI Lynn Rocha is a 28 y.o. female.     HPI  Patient is a 28 year old female with past medical history of GERD and depression presenting for nausea, vomiting, pelvic pain, left upper quadrant pain, and chills.  Patient reports that her symptoms have been ongoing for 4 days.  Patient reports that she has vomited approximately 10 times per day, nonbilious and nonbloody.  She reports that 2 days ago she began having some suprapubic and right lower quadrant tenderness.  Patient describes it as dull and constant.  She is also having some left upper quadrant discomfort but not as severe.  Patient reports last bowel movement may have been within the last week.  Denies diarrhea.  Patient ports that she has had some increased thick and white vaginal discharge.  She did go to a clinic 2 weeks ago to get tested for STI but reports she did not receive call back.  Patient reports that she had a positive home pregnancy test 5 days ago.  LMP 03-01-2018.  She denies any vaginal bleeding over the course of her illness.  Patient denies any coughing, fevers, but does feel that she has been more short of breath over the past 4 days.  She reports that the only movement outside of the home is to go to the grocery store.  Denies any recent travel, sick contacts, or known COVID-19 exposure.  Patient ports that she has had 3 prior pregnancies in [place] without complication.  Patient reports that she has had prior nausea and vomiting in early pregnancy, but not this severe. Patient reports she has had one sexual partner in the last 3 months.   Past Medical History:  Diagnosis Date  . Acid reflux   . Depression     There are no active problems to display for this patient.   History reviewed.  No pertinent surgical history.   OB History   No obstetric history on file.      Home Medications    Prior to Admission medications   Not on File    Family History No family history on file.  Social History Social History   Tobacco Use  . Smoking status: Current Every Day Smoker    Packs/day: 0.25  . Smokeless tobacco: Never Used  Substance Use Topics  . Alcohol use: Yes    Comment: occasionally  . Drug use: Never     Allergies   Patient has no known allergies.   Review of Systems Review of Systems  Constitutional: Positive for chills. Negative for fever.  HENT: Negative for congestion, rhinorrhea, sinus pain and sore throat.   Eyes: Negative for visual disturbance.  Respiratory: Positive for shortness of breath. Negative for cough and chest tightness.   Cardiovascular: Negative for chest pain, palpitations and leg swelling.  Gastrointestinal: Positive for abdominal pain, nausea and vomiting. Negative for constipation and diarrhea.  Genitourinary: Positive for frequency, pelvic pain and vaginal discharge. Negative for dysuria, flank pain and vaginal bleeding.  Musculoskeletal: Negative for back pain and myalgias.  Skin: Negative for rash.  Neurological: Negative for dizziness, syncope, light-headedness and headaches.     Physical Exam Updated Vital Signs BP 121/77 (BP Location: Right Arm)   Pulse 83   Temp 98.8 F (  37.1 C) (Oral)   Resp 14   LMP 03/01/2018   SpO2 100%   Physical Exam Vitals signs and nursing note reviewed.  Constitutional:      General: She is not in acute distress.    Appearance: She is well-developed.  HENT:     Head: Normocephalic and atraumatic.  Eyes:     Conjunctiva/sclera: Conjunctivae normal.     Pupils: Pupils are equal, round, and reactive to light.  Neck:     Musculoskeletal: Normal range of motion and neck supple.  Cardiovascular:     Rate and Rhythm: Normal rate and regular rhythm.     Heart sounds: S1 normal and  S2 normal. No murmur.  Pulmonary:     Effort: Pulmonary effort is normal.     Breath sounds: Normal breath sounds. No wheezing or rales.     Comments: Normal respiratory effort. Converses comfortably.  Abdominal:     General: There is no distension.     Palpations: Abdomen is soft.     Tenderness: There is abdominal tenderness in the right lower quadrant and suprapubic area. There is no guarding.  Musculoskeletal: Normal range of motion.        General: No deformity.  Lymphadenopathy:     Cervical: No cervical adenopathy.  Skin:    General: Skin is warm and dry.     Findings: No erythema or rash.  Neurological:     Mental Status: She is alert.     Comments: Cranial nerves grossly intact. Patient moves extremities symmetrically and with good coordination.  Psychiatric:        Behavior: Behavior normal.        Thought Content: Thought content normal.        Judgment: Judgment normal.      ED Treatments / Results  Labs (all labs ordered are listed, but only abnormal results are displayed) Labs Reviewed  COMPREHENSIVE METABOLIC PANEL - Abnormal; Notable for the following components:      Result Value   Potassium 3.3 (*)    CO2 20 (*)    All other components within normal limits  CBC WITH DIFFERENTIAL/PLATELET - Abnormal; Notable for the following components:   RBC 5.16 (*)    MCV 74.8 (*)    MCH 23.6 (*)    All other components within normal limits  I-STAT BETA HCG BLOOD, ED ([facility], [facility], [facility] ONLY) - Abnormal; Notable for the following components:   I-stat hCG, quantitative >2,000.0 (*)    All other components within normal limits  WET PREP, GENITAL  URINE CULTURE  LIPASE, BLOOD  RPR  HIV ANTIBODY (ROUTINE TESTING W REFLEX)  URINALYSIS, ROUTINE W REFLEX MICROSCOPIC  HCG, QUANTITATIVE, PREGNANCY  GC/CHLAMYDIA PROBE AMP ([place]) NOT AT [facility]    EKG None  Radiology No results found.  Procedures Procedures (including critical care time)  Medications Ordered in ED  Medications  sodium chloride 0.9 % bolus 1,000 mL (has no administration in time range)  metoCLOPramide (REGLAN) injection 10 mg (has no administration in time range)  diphenhydrAMINE (BENADRYL) injection 12.5 mg (has no administration in time range)     Initial Impression / Assessment and Plan / ED Course  I have reviewed the triage vital signs and the nursing notes.  Pertinent labs & imaging results that were available during my care of the patient were reviewed by me and considered in my medical decision making (see chart for details).  Clinical Course as of Apr 09 1505  Sun Apr 10, 2018  1442 [facility] and need for [place].   I-stat hCG, quantitative(!): >2,000.0 [AM]  1452 Spoke with Sharen Counter APP for [facility]U who will see patient in [facility]. States patient is OK for transfer. Appreciate her involvement.    [AM]  1506 Likely 2/2 volume depletion from vomiting.   CO2(!): 20 [AM]    Clinical Course User Index [AM] Elisha Ponder, PA-C       Patient is nontoxic-appearing, afebrile, and hemodynamically stable.  No tachypnea, tachycardia, or hypoxia.  Differential diagnosis includes hyperemesis gravidarum, gastroenteritis, topic pregnancy, pyelonephritis, appendicitis.  Given patient's history of her symptoms are nausea and vomiting, as well as nausea vomiting and the predominant symptom with abdominal pain developing later in the week, feel that this is most likely related to pregnancy.  She will need a pelvic ultrasound, but anticipate transfer to [facility].  I do feel that patient is at high risk of sexual transmitted infection she has had multiple infections in the past 3 months and is with the same partner.   I have low suspicion for respiratory illness or COVID-19 at this time.  Patient is not experiencing respiratory symptoms.  She is afebrile here and has not been taking antipyretics.  Will confirm pregnancy, check  quantitative beta-hCG, and give antiemetics and fluids.  Beta-hCG is positive.  No leukocytosis.  Normal hemoglobin.  CMP and quantitative beta-hCG are pending.  Patient be transferred for further care and [facility].  This is a supervised visit with Dr. Margarita Grizzle. Evaluation, management, and disposition planning discussed with this attending physician.  Final Clinical Impressions(s) / ED Diagnoses   Final diagnoses:  Non-intractable vomiting with nausea, unspecified vomiting type  First trimester pregnancy    ED Discharge Orders    None       Delia Chimes 04/10/18 1508    Margarita Grizzle, MD 04/11/18 1029

## 2018-04-11 LAB — HIV ANTIBODY (ROUTINE TESTING W REFLEX): HIV Screen 4th Generation wRfx: NONREACTIVE

## 2018-04-11 LAB — RPR: RPR Ser Ql: NONREACTIVE

## 2018-04-11 LAB — GC/CHLAMYDIA PROBE AMP (~~LOC~~) NOT AT ARMC
Chlamydia: NEGATIVE
Neisseria Gonorrhea: NEGATIVE

## 2018-04-20 ENCOUNTER — Telehealth: Payer: Self-pay | Admitting: *Deleted

## 2018-04-20 MED ORDER — METRONIDAZOLE 0.75 % EX GEL: 1.0000 "application " | Freq: Two times a day (BID) | CUTANEOUS | 0 refills | Status: AC

## 2018-04-20 NOTE — Telephone Encounter (Signed)
Pt left VM message stating that she was seen @ [facility] on 4/5 by Leftwich-Kirby and was supposed to receive Rx for Metrogel but was given the pills instead. She did not obtain Rx from pharmacy because she cannot tolerate the pills. Rx for Metrogel e-prescribed to pt's [facility] pharmacy following consult w/Dr. Debroah Loop. Pt was notified and she voiced understanding.

## 2018-06-26 ENCOUNTER — Emergency Department (HOSPITAL_COMMUNITY): Payer: Medicaid Other

## 2018-06-26 ENCOUNTER — Encounter (HOSPITAL_COMMUNITY): Payer: Self-pay

## 2018-06-26 ENCOUNTER — Other Ambulatory Visit: Payer: Self-pay

## 2018-06-26 ENCOUNTER — Emergency Department (HOSPITAL_COMMUNITY)
Admission: EM | Admit: 2018-06-26 | Discharge: 2018-06-26 | Disposition: A | Discharge status: Home / Self Care | Payer: Medicaid Other | Attending: Emergency Medicine | Admitting: Emergency Medicine

## 2018-06-26 DIAGNOSIS — M25562 Pain in left knee: Secondary | ICD-10-CM | POA: Diagnosis not present

## 2018-06-26 DIAGNOSIS — Y999 Unspecified external cause status: Secondary | ICD-10-CM | POA: Diagnosis not present

## 2018-06-26 DIAGNOSIS — S63501A Unspecified sprain of right wrist, initial encounter: Secondary | ICD-10-CM | POA: Diagnosis not present

## 2018-06-26 DIAGNOSIS — Z79899 Other long term (current) drug therapy: Secondary | ICD-10-CM | POA: Insufficient documentation

## 2018-06-26 DIAGNOSIS — Y9301 Activity, walking, marching and hiking: Secondary | ICD-10-CM | POA: Insufficient documentation

## 2018-06-26 DIAGNOSIS — M25561 Pain in right knee: Secondary | ICD-10-CM | POA: Diagnosis not present

## 2018-06-26 DIAGNOSIS — F17200 Nicotine dependence, unspecified, uncomplicated: Secondary | ICD-10-CM | POA: Insufficient documentation

## 2018-06-26 DIAGNOSIS — Y92512 Supermarket, store or market as the place of occurrence of the external cause: Secondary | ICD-10-CM | POA: Insufficient documentation

## 2018-06-26 DIAGNOSIS — W010XXA Fall on same level from slipping, tripping and stumbling without subsequent striking against object, initial encounter: Secondary | ICD-10-CM | POA: Diagnosis not present

## 2018-06-26 DIAGNOSIS — S6991XA Unspecified injury of right wrist, hand and finger(s), initial encounter: Secondary | ICD-10-CM | POA: Diagnosis present

## 2018-06-26 IMAGING — DX RIGHT HAND - COMPLETE 3+ VIEW
3 series · 3 of 3 positions shown · non-contrast
Comparison: None.

CLINICAL DATA: Status post fall.

EXAM:
RIGHT HAND - COMPLETE 3+ VIEW

[hand pa]
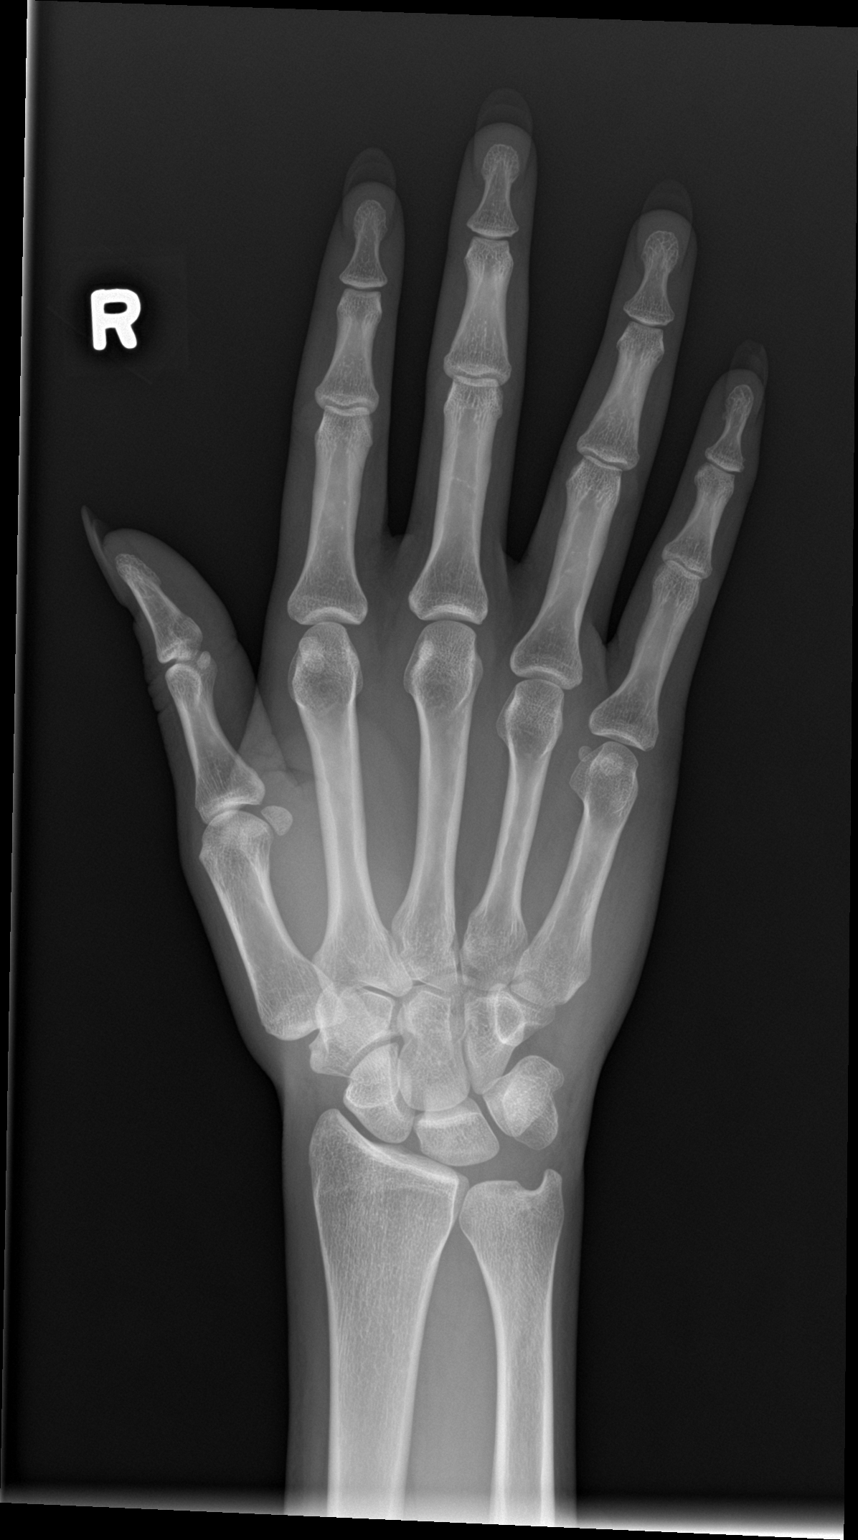

[hand obl]
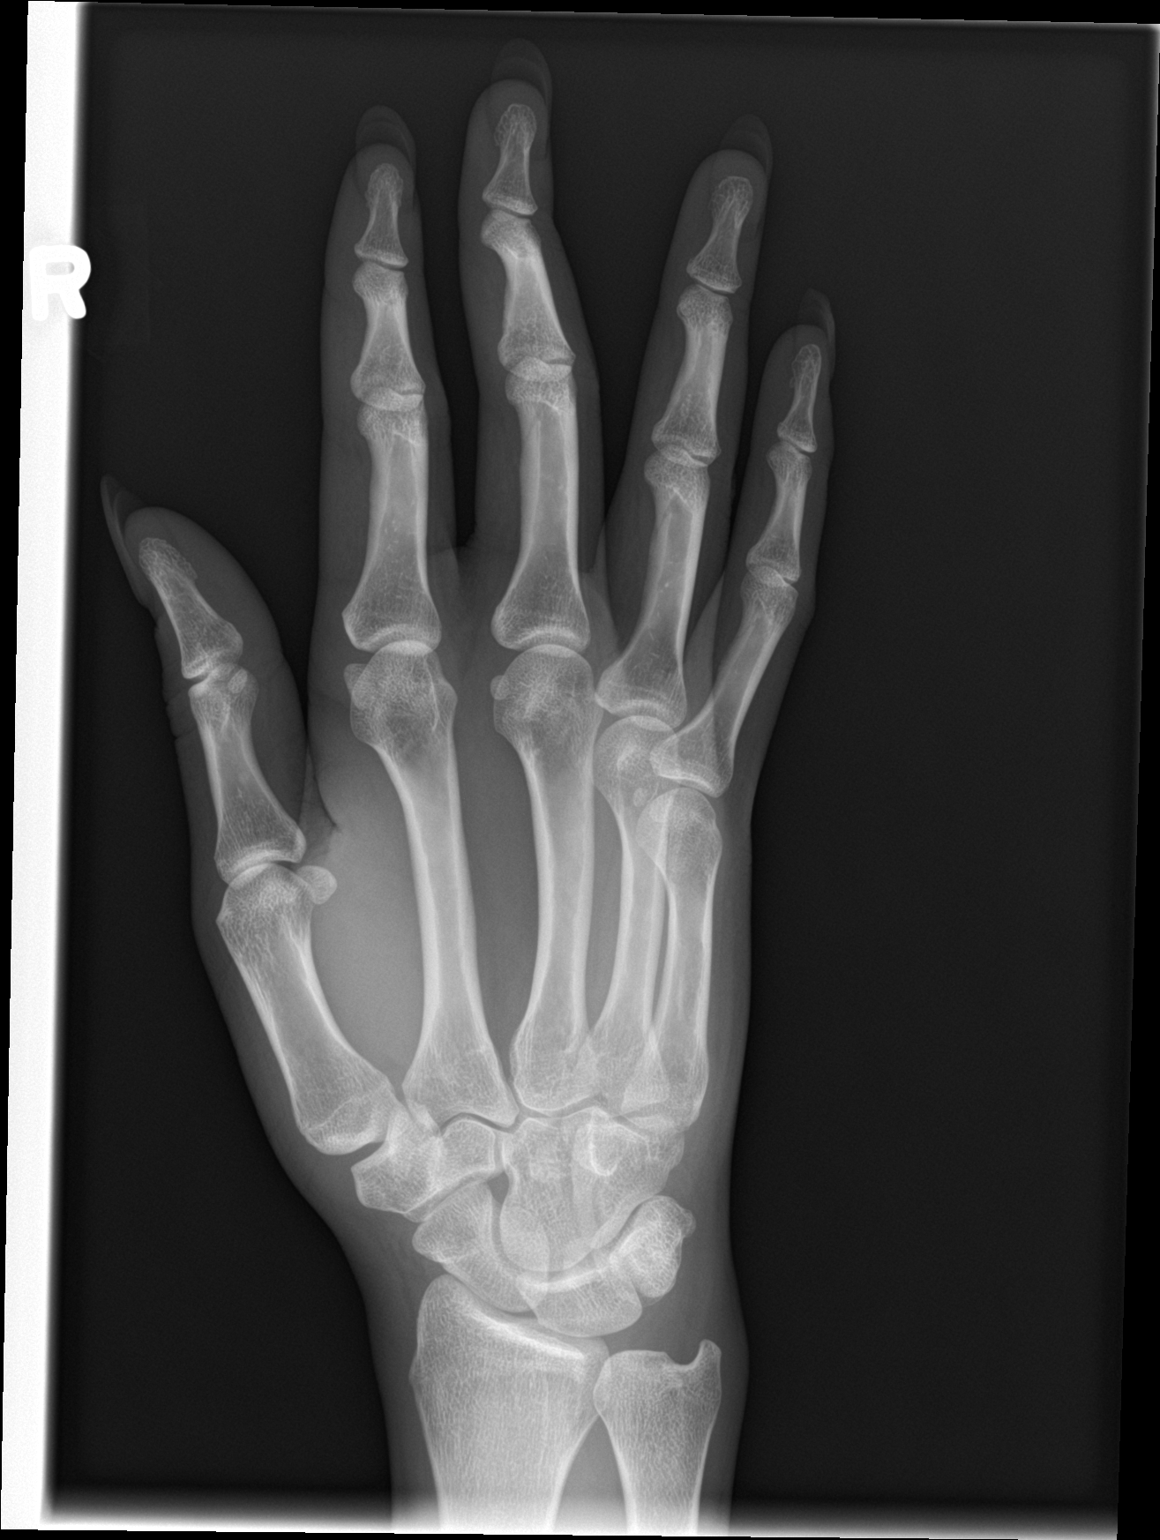

[hand lat]
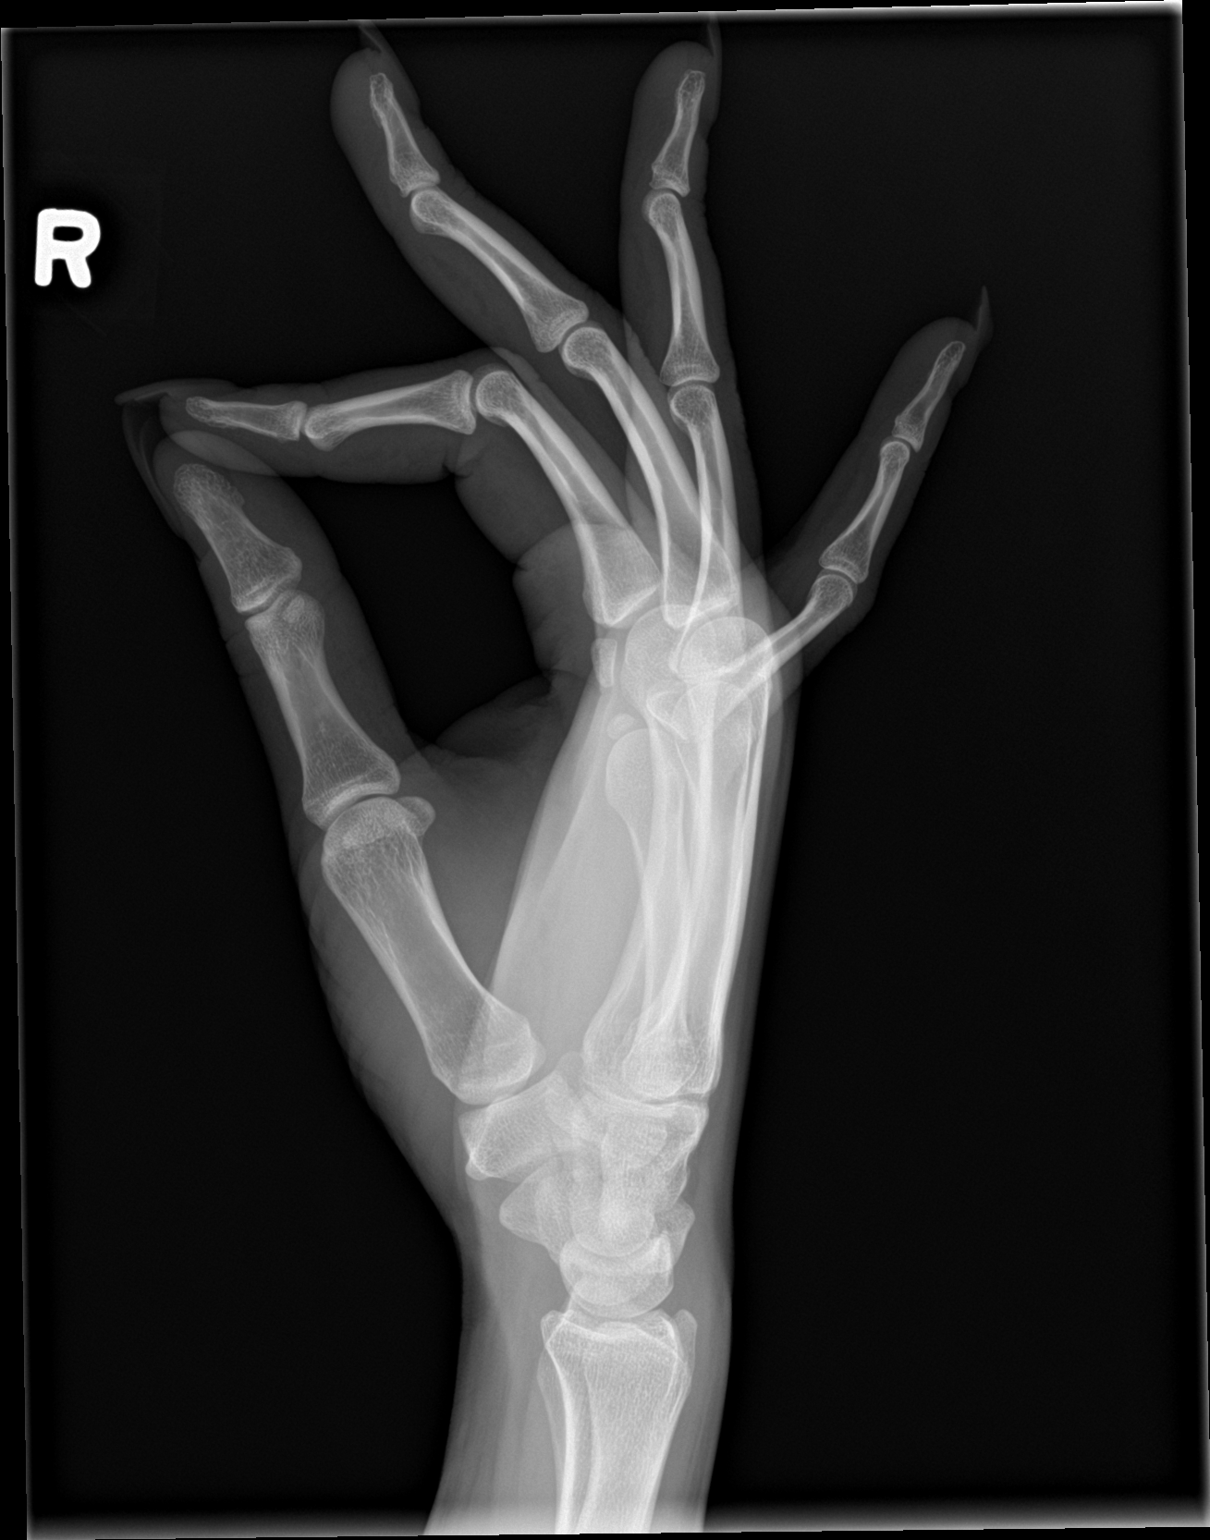

[3 of 3 positions shown; findings below may reference images not displayed]

FINDINGS: There is no evidence of fracture or dislocation. There is no
evidence of arthropathy or other focal bone abnormality. Soft
tissues are unremarkable.
IMPRESSION: Negative.

## 2018-06-26 IMAGING — DX LEFT FEMUR 2 VIEWS
4 series · 4 of 4 positions shown · non-contrast
Comparison: None.

CLINICAL DATA: Status post fall.  Pain.

EXAM:
LEFT FEMUR 2 VIEWS

[femur ap (1 of 2)]
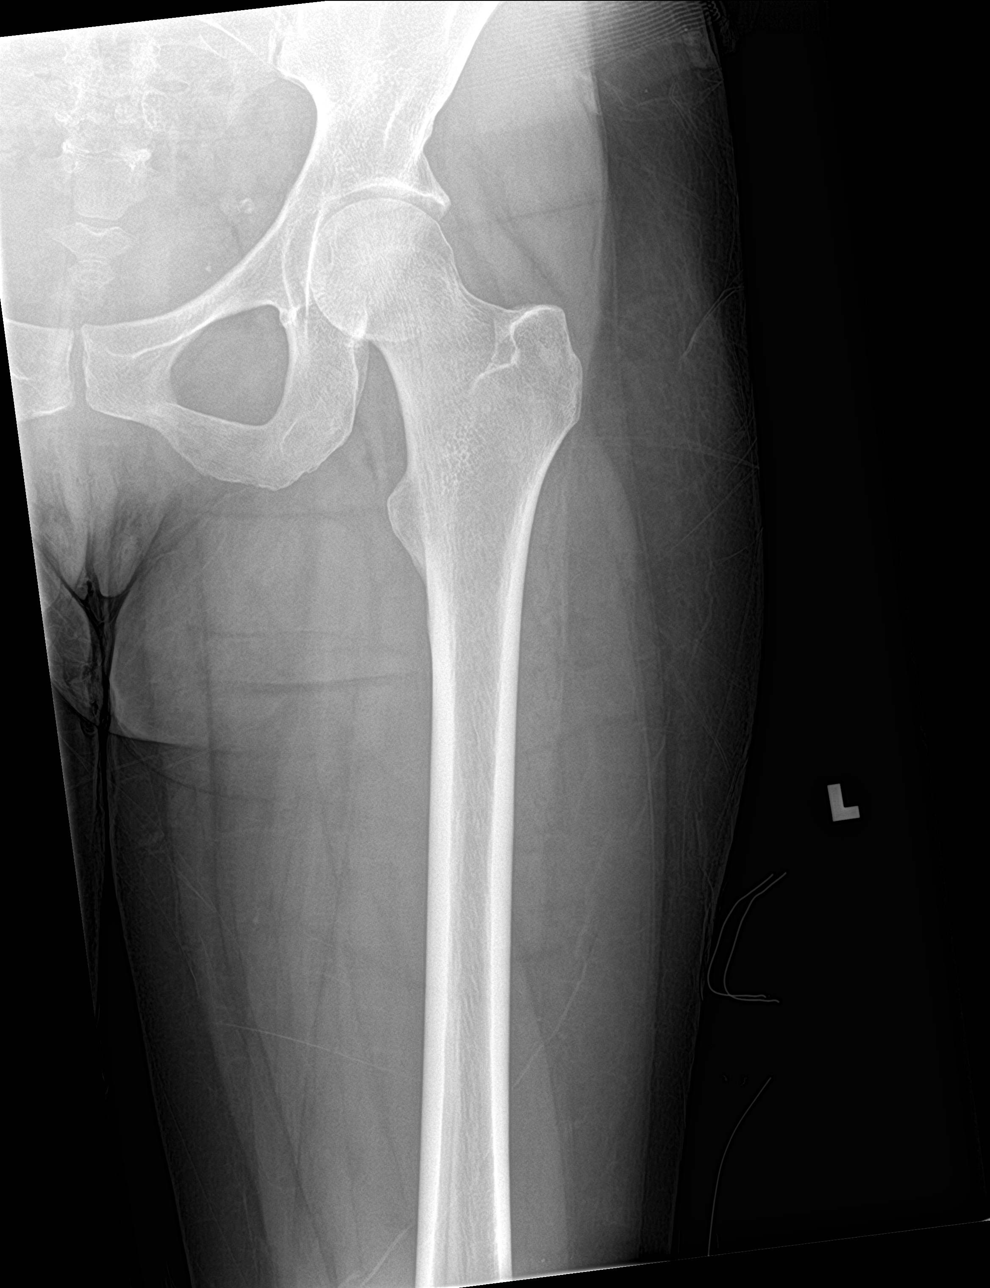

[femur lat (1 of 2)]
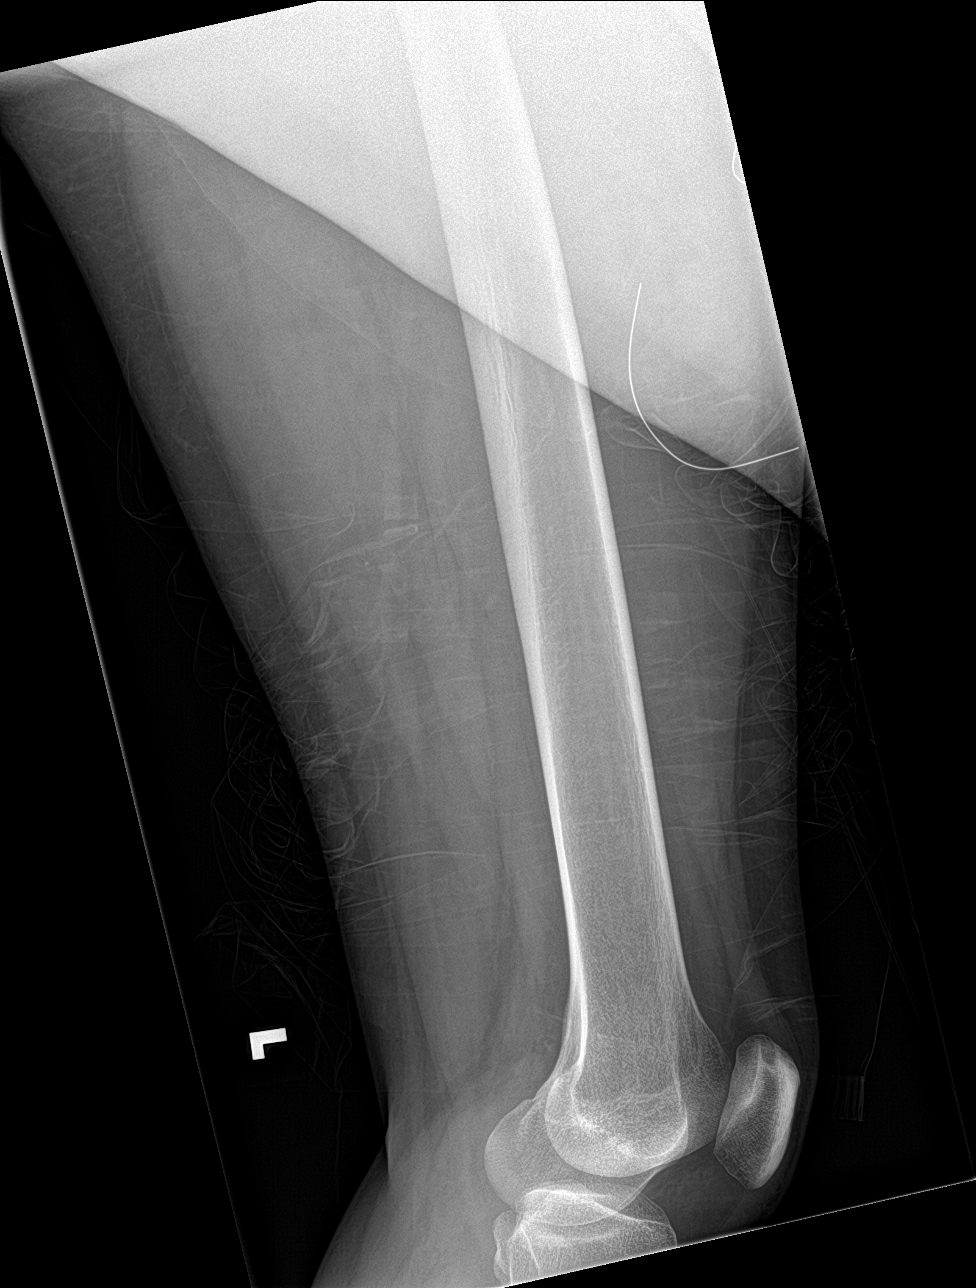

[femur lat (2 of 2)]
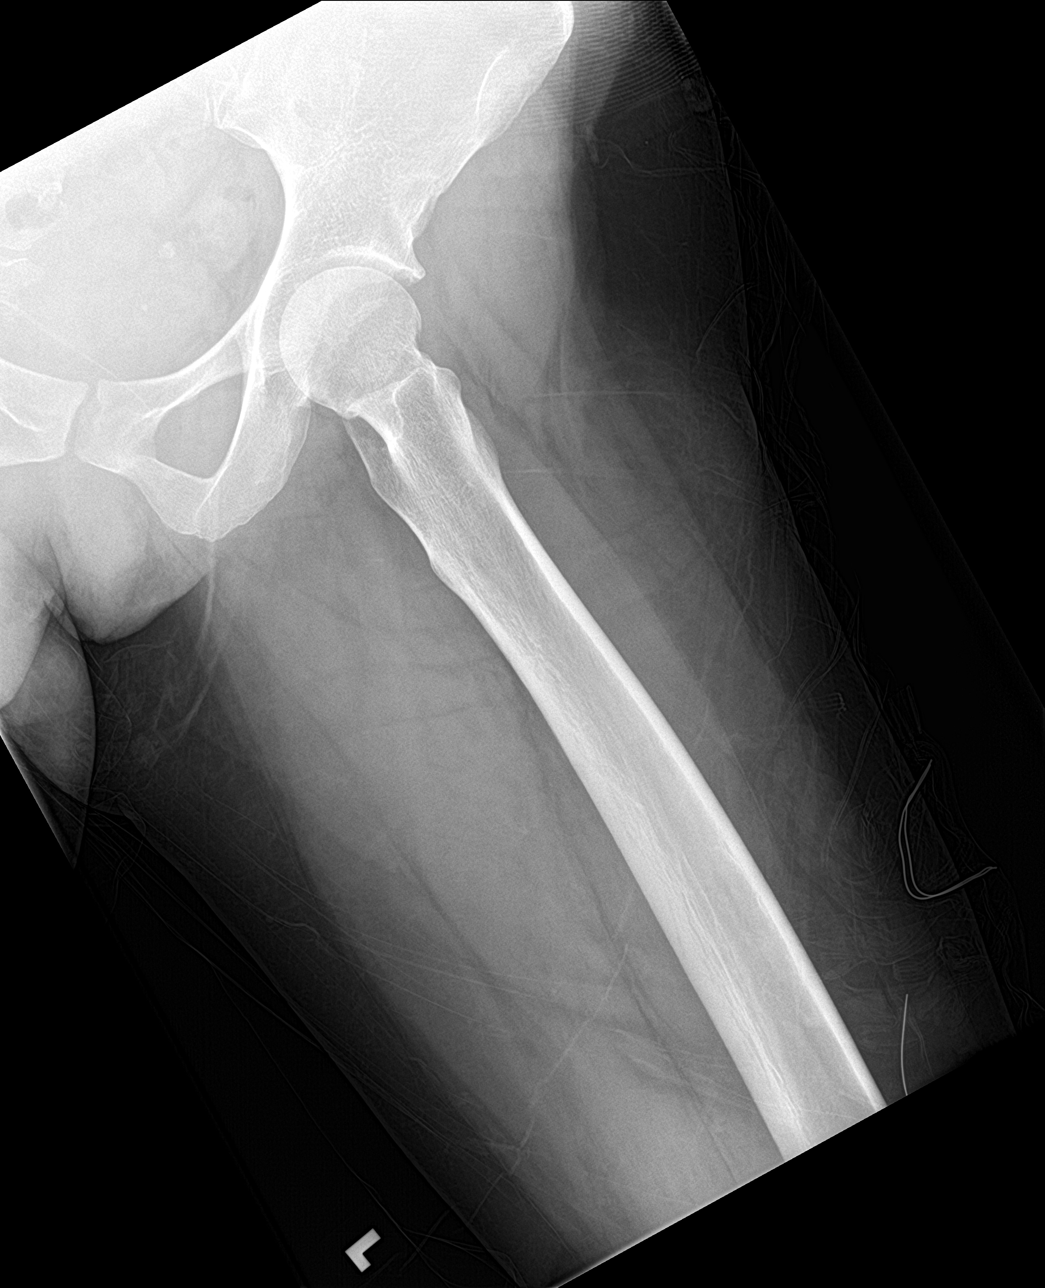

[femur ap (2 of 2)]
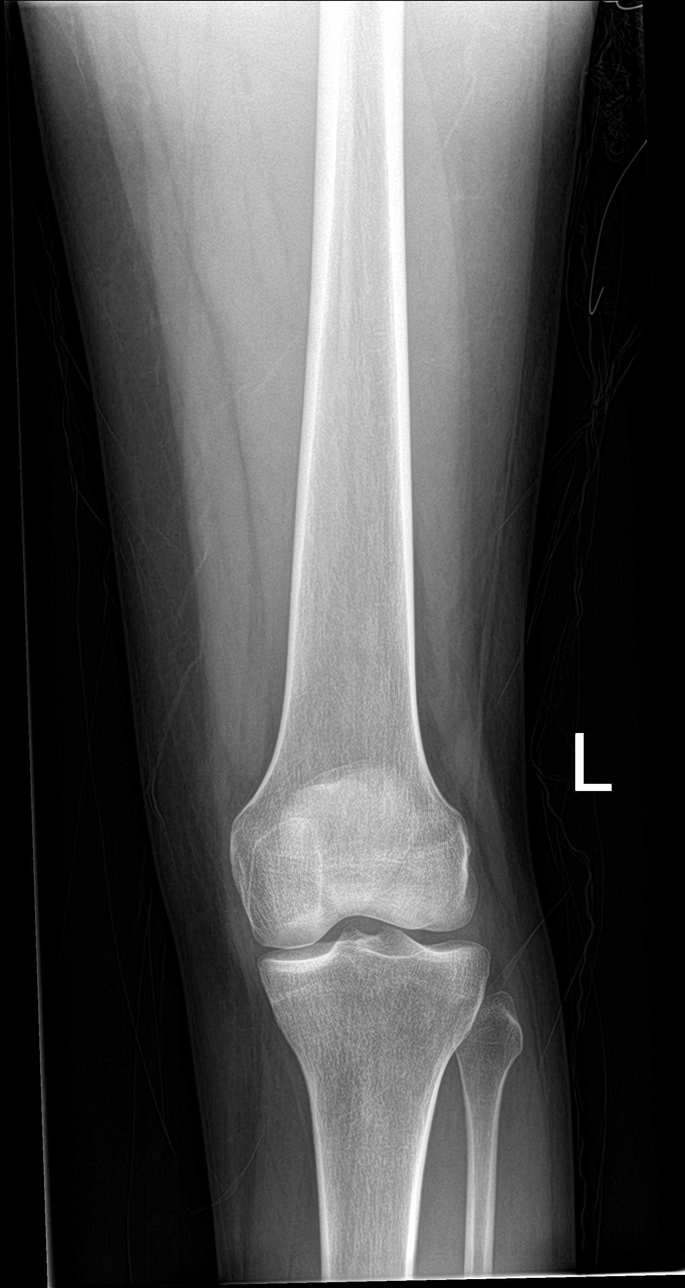

[4 of 4 positions shown; findings below may reference images not displayed]

FINDINGS: There is no evidence of fracture or other focal bone lesions. Soft
tissues are unremarkable.
IMPRESSION: Negative.

## 2018-06-26 IMAGING — DX LEFT KNEE - COMPLETE 4+ VIEW
8 series · 8 of 8 positions shown · non-contrast
Comparison: None.

CLINICAL DATA: Status post fall.  Pain.

EXAM:
LEFT KNEE - COMPLETE 4+ VIEW

[knee ap (1 of 2)]
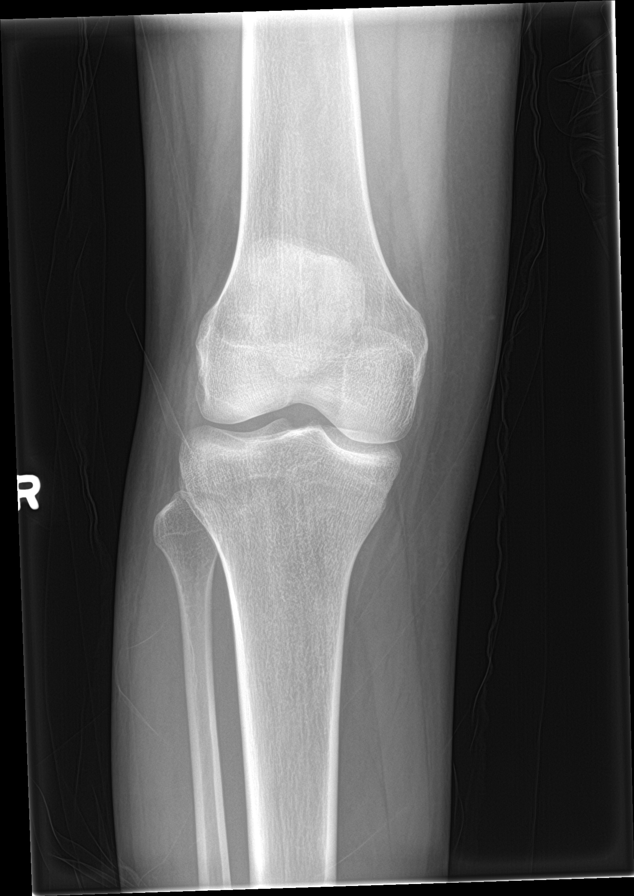

[knee lat (1 of 2)]
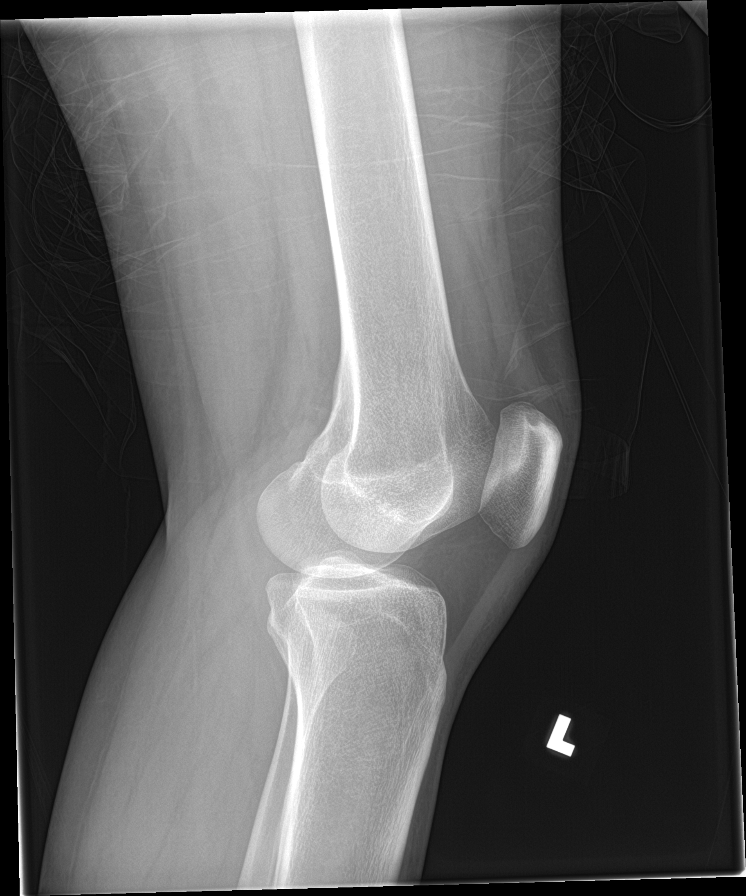

[knee lat (2 of 2)]
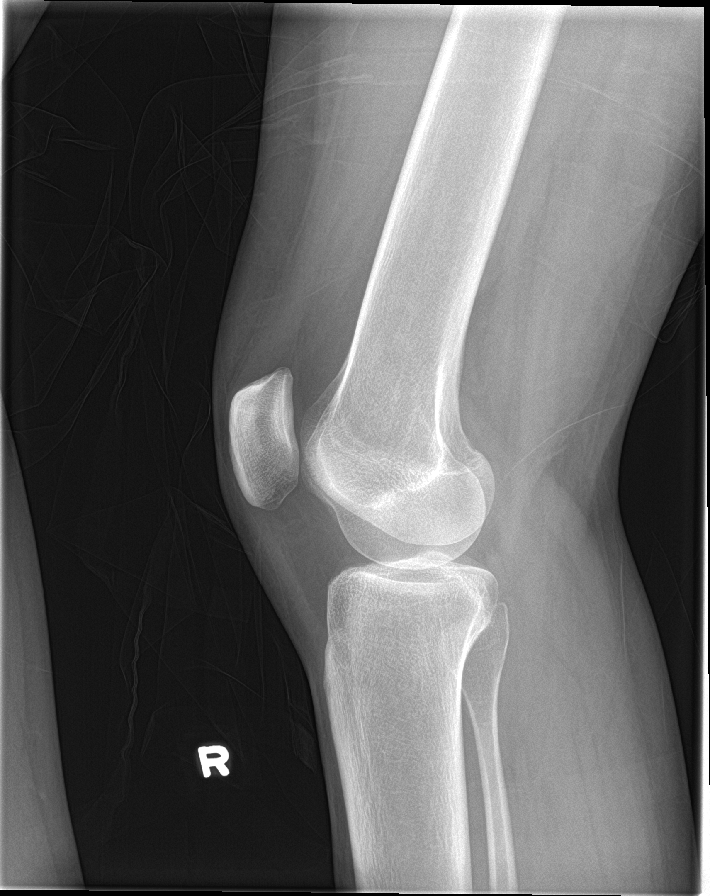

[knee obl (1 of 4)]
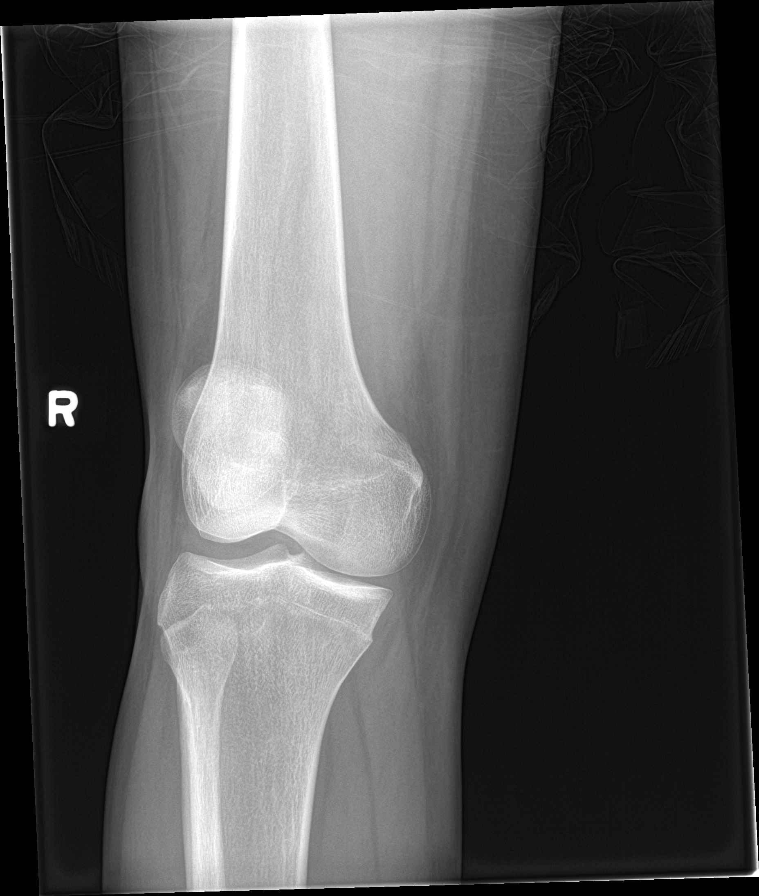

[knee obl (2 of 4)]
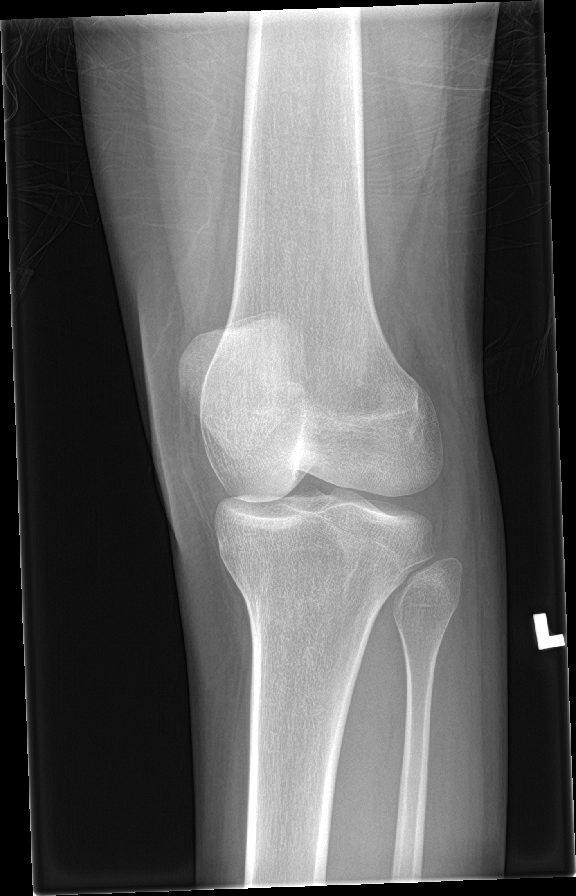

[knee obl (3 of 4)]
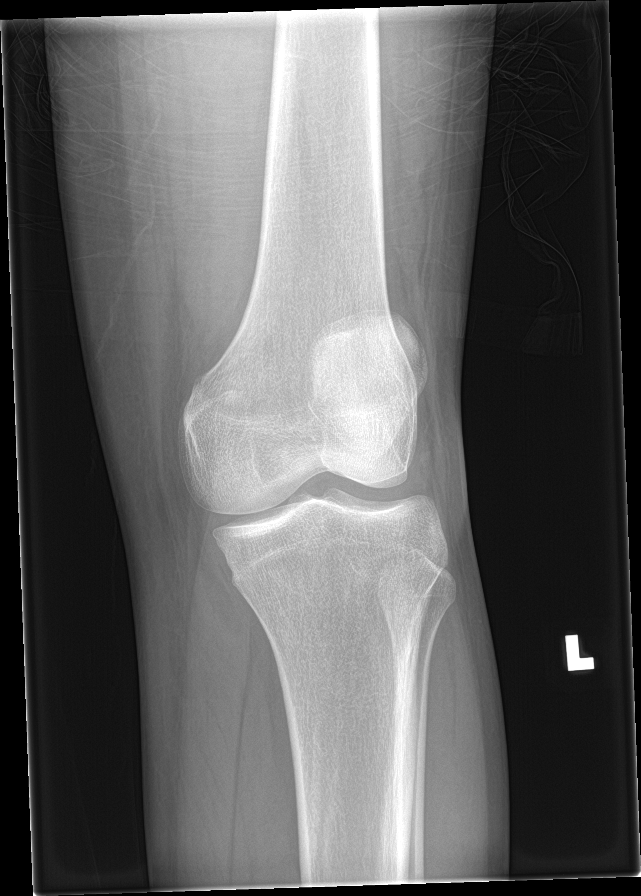

[knee obl (4 of 4)]
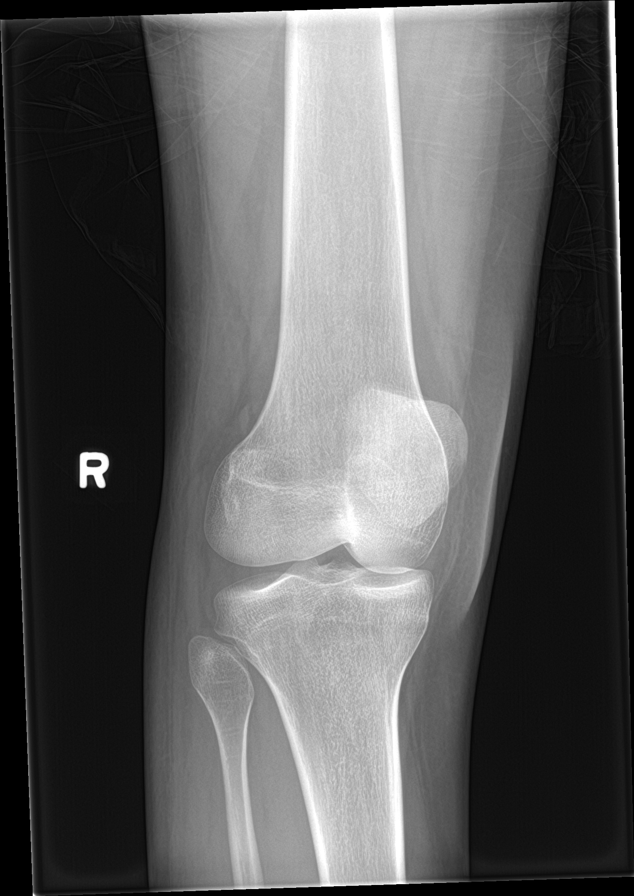

[knee ap (2 of 2)]
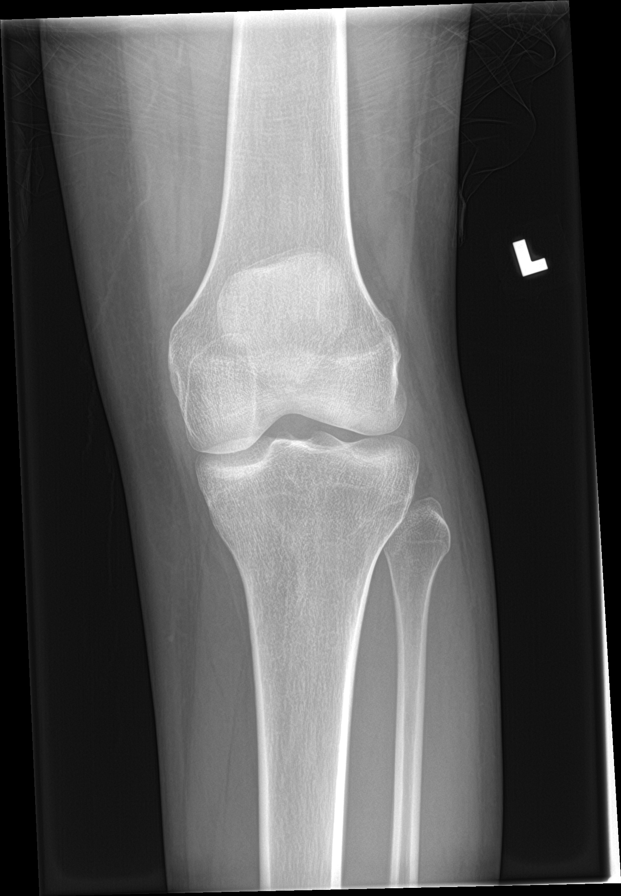

[8 of 8 positions shown; findings below may reference images not displayed]

FINDINGS: No evidence of fracture, dislocation, or joint effusion. No evidence
of arthropathy or other focal bone abnormality. Soft tissues are
unremarkable.
IMPRESSION: Negative.

## 2018-06-26 IMAGING — DX RIGHT KNEE - COMPLETE 4+ VIEW
8 series · 8 of 8 positions shown · non-contrast
Comparison: None.

CLINICAL DATA: Status post fall.  Pain.

EXAM:
RIGHT KNEE - COMPLETE 4+ VIEW

[knee ap (1 of 2)]
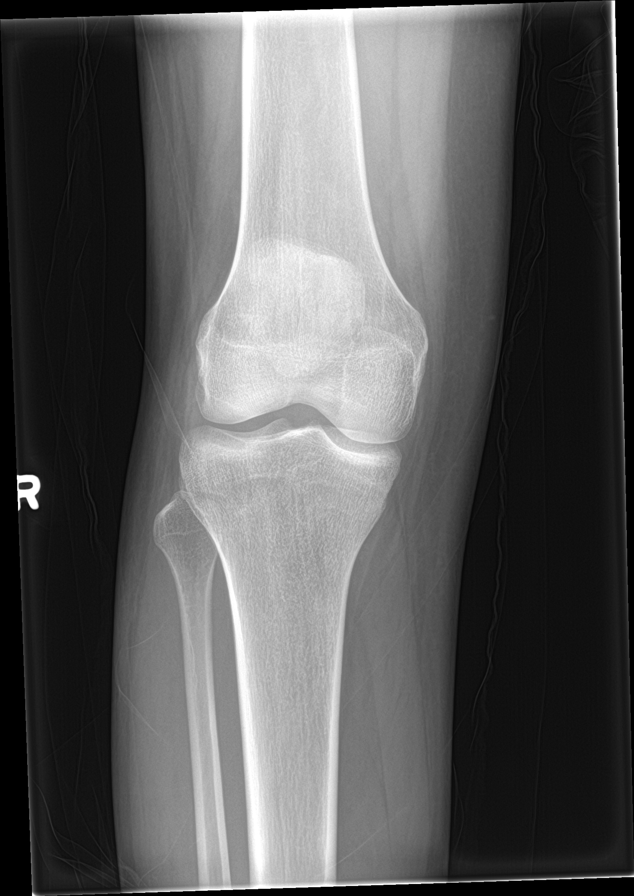

[knee lat (1 of 2)]
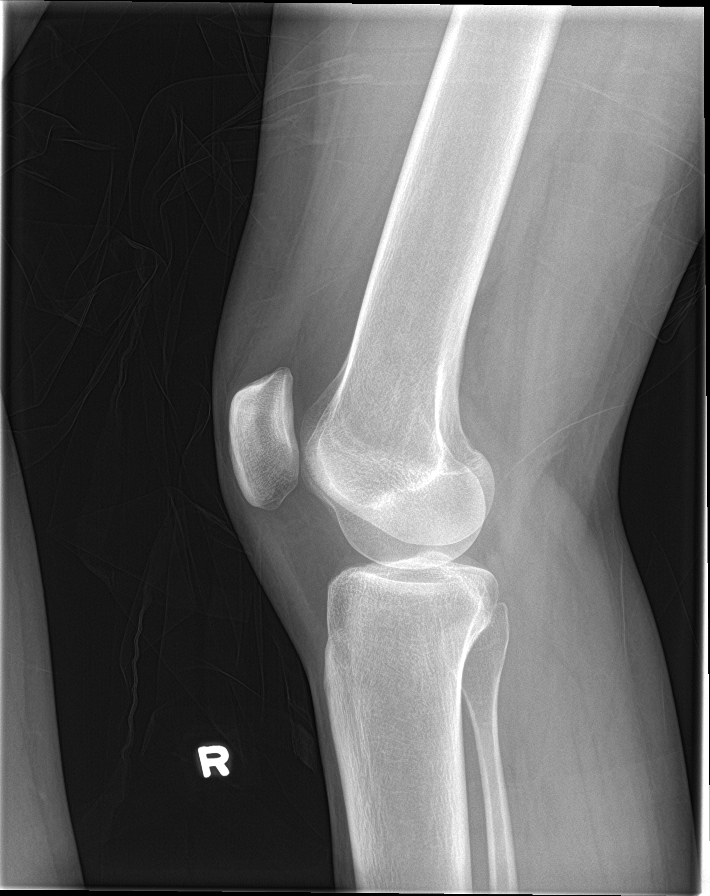

[knee lat (2 of 2)]
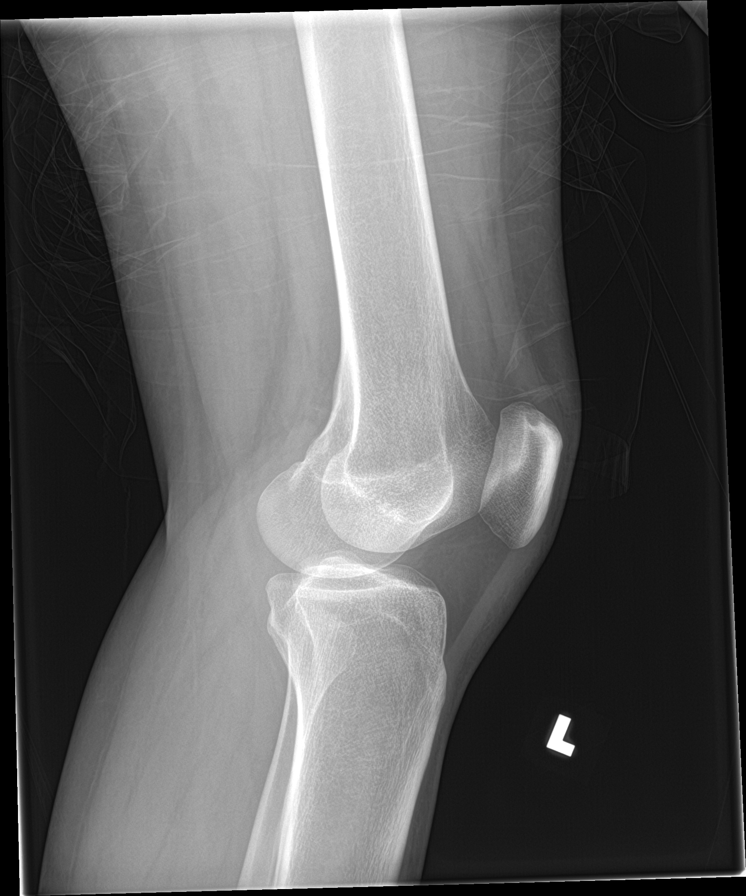

[knee obl (1 of 4)]
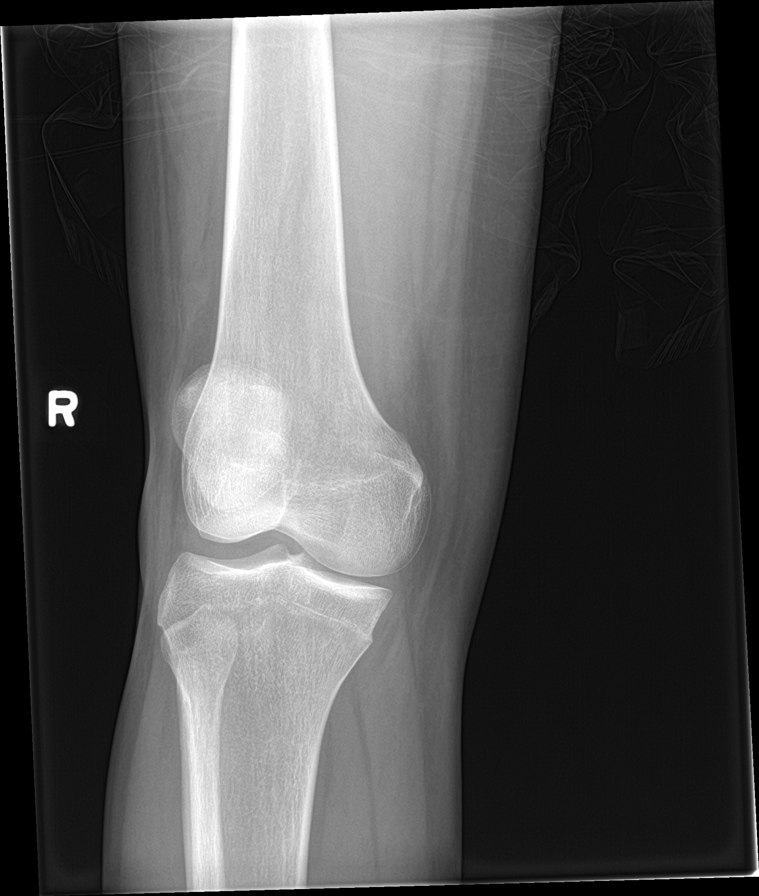

[knee obl (2 of 4)]
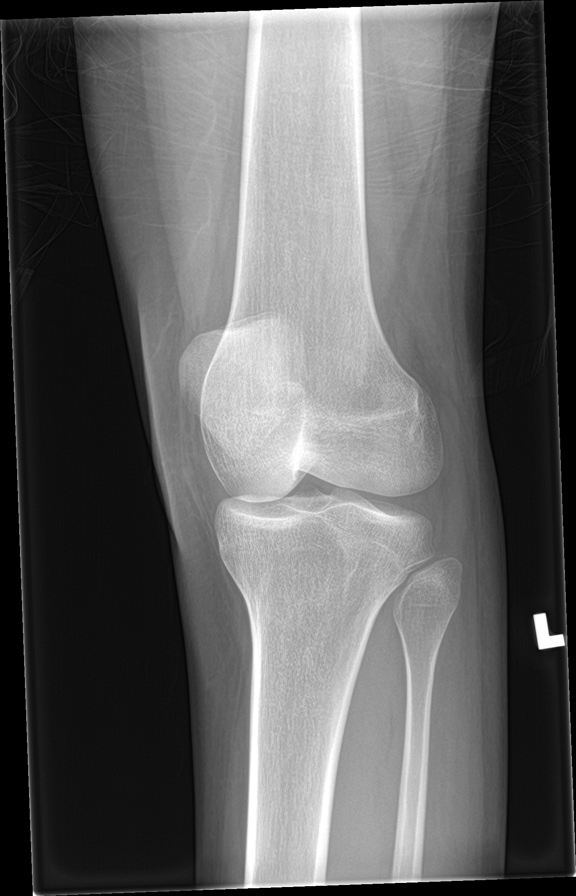

[knee obl (3 of 4)]
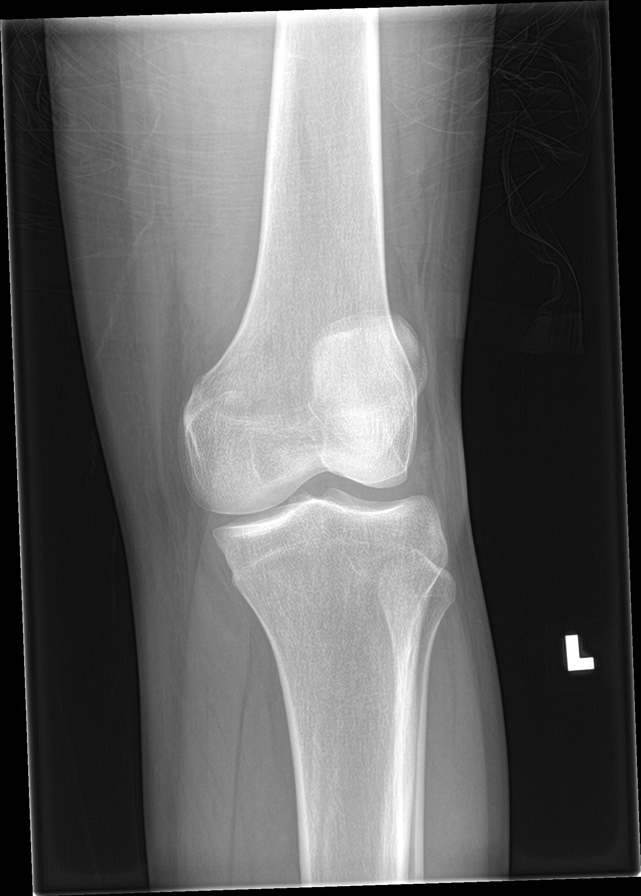

[knee obl (4 of 4)]
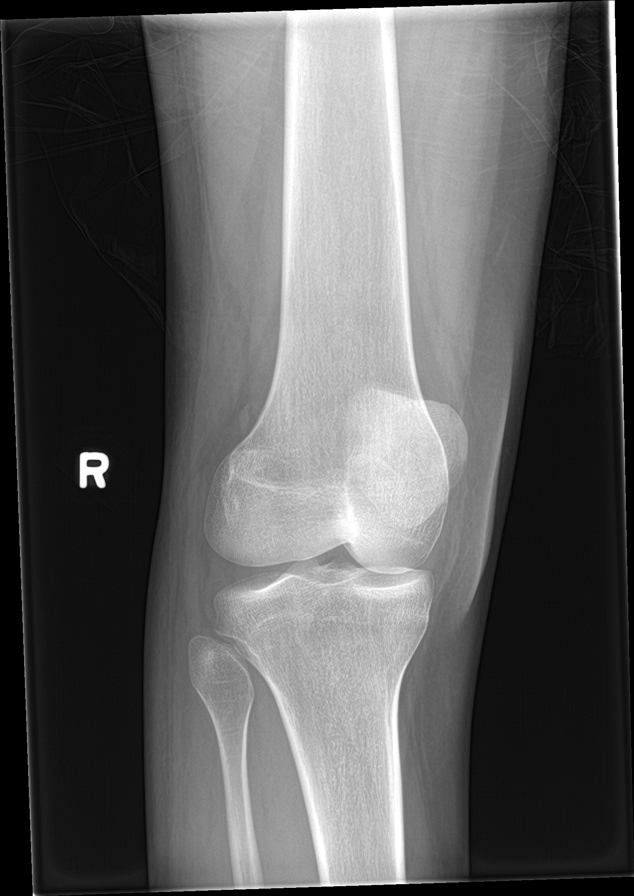

[knee ap (2 of 2)]
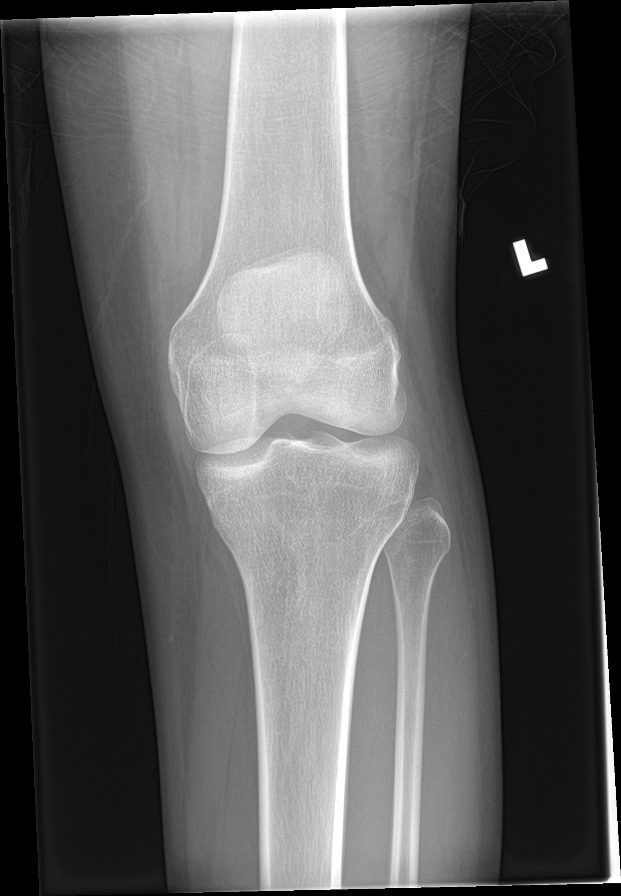

[8 of 8 positions shown; findings below may reference images not displayed]

FINDINGS: No evidence of fracture, dislocation, or joint effusion. No evidence
of arthropathy or other focal bone abnormality. Soft tissues are
unremarkable.
IMPRESSION: Negative.

## 2018-06-26 MED ORDER — ACETAMINOPHEN 500 MG PO TABS
1000.0000 mg | ORAL_TABLET | Freq: Once | ORAL | Status: AC
  Administered 2018-06-26: 1000 mg via ORAL
  Filled 2018-06-26: qty 2

## 2018-06-26 MED ORDER — IBUPROFEN 400 MG PO TABS
400.0000 mg | ORAL_TABLET | Freq: Once | ORAL | Status: AC
  Administered 2018-06-26: 400 mg via ORAL
  Filled 2018-06-26: qty 1

## 2018-06-26 NOTE — ED Notes (Signed)
Patient verbalizes understanding of discharge instructions. Opportunity for questioning and answers were provided. Armband removed by staff, pt discharged from ED.  

## 2018-06-26 NOTE — Discharge Instructions (Signed)
You can take Tylenol or Ibuprofen as directed for pain. You can alternate Tylenol and Ibuprofen every 4 hours. If you take Tylenol at 1pm, then you can take Ibuprofen at 5pm. Then you can take Tylenol again at 9pm.   Follow the RICE (Rest, Ice, Compression, Elevation) protocol as directed.   Wear splint for support and stabilization.  Return the emergency room for any worsening pain, redness or swelling of the knees or hands, unable to walk or bear weight or any other worsening or concerning symptoms.

## 2018-06-26 NOTE — ED Provider Notes (Signed)
[facility]GENC[facility] Provider Note   CSN: [id number] Arrival date & time: 06/26/18  1704    History   Chief Complaint Chief Complaint  Patient presents with  . Knee Pain  . Hand Pain    HPI Lynn Rocha is a 28 y.o. female who presents for evaluation of right hand and bilateral knee pain that began today after mechanical fall.  Patient reports that [place] [employer] when something was wet on the floor and she slipped, causing her to fall the ground.  She is unsure of how she fell.  She thinks she hit both her knees on the ground and hit her hand.  She denies any head injury or LOC.  She has been able to ambulate since the incident.  Patient denies any numbness/weakness, neck pain, back pain.     The history is provided by the patient.    Past Medical History:  Diagnosis Date  . Acid reflux   . Depression     There are no active problems to display for this patient.   History reviewed. No pertinent surgical history.   OB History    Gravida  4   Para  3   Term      Preterm      AB      Living  3     SAB      TAB      Ectopic      Multiple      Live Births               Home Medications    Prior to Admission medications   Medication Sig Start Date End Date Taking? Authorizing Provider  metroNIDAZOLE (METROGEL) 0.75 % gel Apply 1 application topically 2 (two) times daily. 04/20/18   Adam PhenixArnold, James G, MD  promethazine (PHENERGAN) 25 MG tablet Take 0.5-1 tablets (12.5-25 mg total) by mouth every 6 (six) hours as needed for nausea. 04/10/18   Leftwich-Kirby, Wilmer FloorLisa A, CNM    Family History Family History  Problem Relation Age of Onset  . Asthma Sister     Social History Social History   Tobacco Use  . Smoking status: Current Some Day Smoker    Packs/day: 0.25  . Smokeless tobacco: Never Used  Substance Use Topics  . Alcohol use: Yes    Comment: occasionally  . Drug use: Never     Allergies   Patient has no  known allergies.   Review of Systems Review of Systems  Respiratory: Negative for cough and shortness of breath.   Cardiovascular: Negative for chest pain.  Gastrointestinal: Negative for abdominal pain, nausea and vomiting.  Genitourinary: Negative for dysuria and hematuria.  Musculoskeletal: Negative for back pain and neck pain.       Bilateral knee pain Right hand pain  Neurological: Negative for weakness, numbness and headaches.  All other systems reviewed and are negative.    Physical Exam Updated Vital Signs BP 120/69   Pulse 71   Temp 98.8 F (37.1 C) (Oral)   Resp 16   LMP 06/03/2018 (Exact Date)   SpO2 100%   Breastfeeding Unknown   Physical Exam Vitals signs and nursing note reviewed.  Constitutional:      Appearance: She is well-developed.  HENT:     Head: Normocephalic and atraumatic.  Eyes:     General: No scleral icterus.       Right eye: No discharge.  Left eye: No discharge.     Conjunctiva/sclera: Conjunctivae normal.  Neck:     Musculoskeletal: Full passive range of motion without pain.     Comments: Full flexion/extension and lateral movement of neck fully intact. No bony midline tenderness. No deformities or crepitus.  Cardiovascular:     Pulses:          Radial pulses are 2+ on the right side and 2+ on the left side.       Dorsalis pedis pulses are 2+ on the right side and 2+ on the left side.  Pulmonary:     Effort: Pulmonary effort is normal.  Musculoskeletal:     Thoracic back: She exhibits no tenderness.     Lumbar back: She exhibits no tenderness.     Comments: Tenderness palpation noted to right hand.  No deformity or crepitus noted.  Patient able to move all 5 digits of right hand intact without any difficulty.  No tenderness palpation noted to wrist, elbow, shoulder.  Flexion/extension of wrist, elbow, shoulder intact without any difficulty.  No snuffbox tenderness.  No tenderness palpation noted to left upper extremity.   Tenderness palpation in the anterior aspect of right knee.  No deformity or crepitus noted.  Flexion/extension intact.  Negative anterior and posterior drawer test.  No instability noted on varus or valgus stress.  Tenderness palpation noted anterior aspect of left knee.  This extends up to the distal femur.  No deformity or crepitus noted.  Flexion/extension intact.  Negative anterior and posterior drawer test.  No instability noted on varus or valgus stress.  No pelvic instability.  No tenderness palpation noted to tib-fib's, ankles bilaterally.  Dorsiflexion and plantarflexion of bilateral feet intact without any difficulty.  Skin:    General: Skin is warm and dry.     Comments: Good distal cap refill.    Neurological:     Mental Status: She is alert.  Psychiatric:        Speech: Speech normal.        Behavior: Behavior normal.      ED Treatments / Results  Labs (all labs ordered are listed, but only abnormal results are displayed) Labs Reviewed - No data to display  EKG None  Radiology Dg Knee Complete 4 Views Left  Result Date: 06/26/2018 CLINICAL DATA:  Status post fall.  Pain. EXAM: LEFT KNEE - COMPLETE 4+ VIEW COMPARISON:  None. FINDINGS: No evidence of fracture, dislocation, or joint effusion. No evidence of arthropathy or other focal bone abnormality. Soft tissues are unremarkable. IMPRESSION: Negative. Electronically Signed   By: Ted Mcalpineobrinka  Dimitrova M.D.   On: 06/26/2018 19:20   Dg Knee Complete 4 Views Right  Result Date: 06/26/2018 CLINICAL DATA:  Status post fall.  Pain. EXAM: RIGHT KNEE - COMPLETE 4+ VIEW COMPARISON:  None. FINDINGS: No evidence of fracture, dislocation, or joint effusion. No evidence of arthropathy or other focal bone abnormality. Soft tissues are unremarkable. IMPRESSION: Negative. Electronically Signed   By: Ted Mcalpineobrinka  Dimitrova M.D.   On: 06/26/2018 19:20   Dg Hand Complete Right  Result Date: 06/26/2018 CLINICAL DATA:  Status post fall. EXAM: RIGHT  HAND - COMPLETE 3+ VIEW COMPARISON:  None. FINDINGS: There is no evidence of fracture or dislocation. There is no evidence of arthropathy or other focal bone abnormality. Soft tissues are unremarkable. IMPRESSION: Negative. Electronically Signed   By: Ted Mcalpineobrinka  Dimitrova M.D.   On: 06/26/2018 19:19   Dg Femur Min 2 Views Left  Result Date: 06/26/2018 CLINICAL  DATA:  Status post fall.  Pain. EXAM: LEFT FEMUR 2 VIEWS COMPARISON:  None. FINDINGS: There is no evidence of fracture or other focal bone lesions. Soft tissues are unremarkable. IMPRESSION: Negative. Electronically Signed   By: Fidela Salisbury M.D.   On: 06/26/2018 19:21    Procedures Procedures (including critical care time)  Medications Ordered in ED Medications  acetaminophen (TYLENOL) tablet 1,000 mg (1,000 mg Oral Given 06/26/18 1750)  ibuprofen (ADVIL) tablet 400 mg (400 mg Oral Given 06/26/18 1936)     Initial Impression / Assessment and Plan / ED Course  I have reviewed the triage vital signs and the nursing notes.  Pertinent labs & imaging results that were available during my care of the patient were reviewed by me and considered in my medical decision making (see chart for details).        28 year old female who presents for evaluation of bilateral knee and right hand pain after mechanical fall that occurred earlier this evening.  No head injury, LOC.  She has been able to ambulate since the incident. Patient is afebrile, non-toxic appearing, sitting comfortably on examination table. Vital signs reviewed and stable. Patient is neurovascularly intact.  Consider contusion versus fracture versus dislocation.  Plan for x-rays.  X-ray femur, knees negative for any acute bony normality.  X-ray of hand negative for acute bony abnormality.  Discussed results with patient.  Discussed with her that there could still be a muscle or ligamentous injury.  Encouraged at home supportive care measures. At this time, patient exhibits  no emergent life-threatening condition that require further evaluation in ED or admission. Patient had ample opportunity for questions and discussion. All patient's questions were answered with full understanding. Strict return precautions discussed. Patient expresses understanding and agreement to plan.   Portions of this note were generated with [employer]. Dictation errors may occur despite best attempts at proofreading.   Final Clinical Impressions(s) / ED Diagnoses   Final diagnoses:  Acute pain of both knees  Sprain of right wrist, initial encounter    ED Discharge Orders    None       Volanda Napoleon, PA-C 06/26/18 2030    Lennice Sites, DO 06/27/18 0024

## 2018-06-26 NOTE — ED Triage Notes (Signed)
Pt from [place] with a c/o bilateral knee and right hand pain that she sustained from a fall. Pt reports [place] when she slipped on some liquid and then falling to the ground. She landed on a tiled floor. No obvious fractures, swelling, erythema, or contusions noted to all sites of pain. Pain with bearing weight. Pt ambulatory.

## 2018-09-09 ENCOUNTER — Other Ambulatory Visit: Payer: Self-pay | Admitting: Orthopedic Surgery

## 2018-09-09 DIAGNOSIS — R52 Pain, unspecified: Secondary | ICD-10-CM

## 2018-09-09 DIAGNOSIS — M25331 Other instability, right wrist: Secondary | ICD-10-CM

## 2018-10-06 ENCOUNTER — Ambulatory Visit
Admission: RE | Admit: 2018-10-06 | Discharge: 2018-10-06 | Disposition: A | Discharge status: Home / Self Care | Payer: Medicaid Other | Source: Ambulatory Visit | Attending: Orthopedic Surgery | Admitting: Orthopedic Surgery

## 2018-10-06 ENCOUNTER — Other Ambulatory Visit: Payer: Self-pay

## 2018-10-06 DIAGNOSIS — M25331 Other instability, right wrist: Secondary | ICD-10-CM

## 2018-10-06 DIAGNOSIS — R52 Pain, unspecified: Secondary | ICD-10-CM

## 2018-10-06 IMAGING — XA DG FLUORO GUIDE NDL PLC/BX
1 series · 1 of 1 positions shown · non-contrast
Comparison: none

CLINICAL DATA: Right wrist pain.  Scapholunate instability.

[Series 1: ortho standard · 1 of 1 slices shown]
[im 1/1]
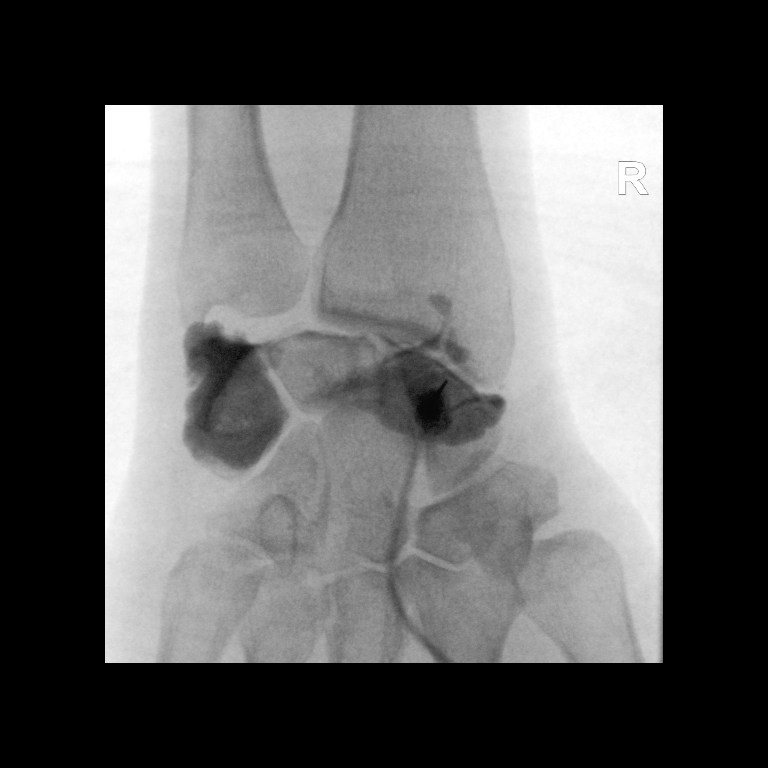

[1 of 1 positions shown; findings below may reference images not displayed]

FLUOROSCOPY TIME:  Fluoroscopy Time: 3 seconds

Radiation Exposure Index: 1.10 microGray*m^2

PROCEDURE:
RIGHT WRIST INJECTION UNDER FLUOROSCOPY

An appropriate skin entrance site was determined. The site was
marked, prepped with Betadine, draped in the usual sterile fashion,
and infiltrated locally with 1% Lidocaine. A 25 gauge skin needle
was advanced into the radiocarpal joint under intermittent
fluoroscopy. A mixture of 0.1 mL of MultiHance, 15 mL of Isovue-M
200, and 5 mL of sterile saline was then used to opacify the
proximal carpal joint. 1.5 mL of this mixture were injected. No
immediate complication.
IMPRESSION: Technically successful right wrist injection for MRI.

## 2018-10-06 IMAGING — MR MR WRIST*R* W/ CM
6 series · 40 of 40 positions shown · IV contrast (agent unspecified)
Comparison: Radiograph 06/26/2018

CLINICAL DATA: History of a fall in June 2018. Persistent radial
sided wrist pain. Weakness and numbness.

EXAM:
MRI OF THE RIGHT WRIST WITH CONTRAST (MR Arthrogram)
TECHNIQUE: Multiplanar, multisequence MR imaging of the wrist was performed
immediately following contrast injection into the radiocarpal joint
under fluoroscopic guidance. No intravenous contrast was
administered.

[Series 3: T1 fat-sat · axial · non-contrast · right · 3.0mm · 0.34mm/px · z∈[-51,-3]mm · 6 of 17 slices shown (1 of 3)]
[im 1/17]
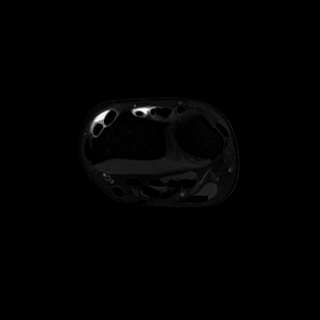
[im 4/17]
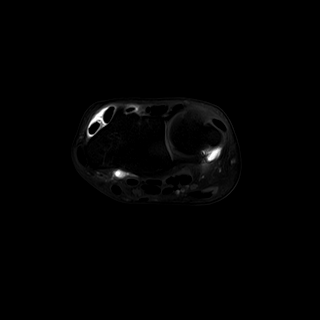
[im 7/17]
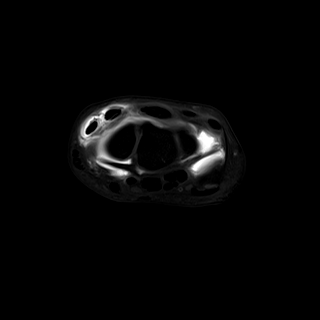
[im 10/17]
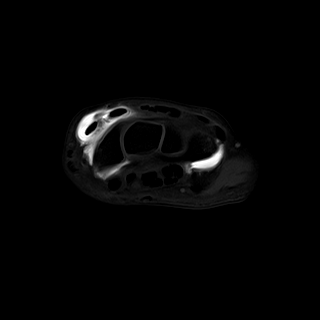
[im 13/17]
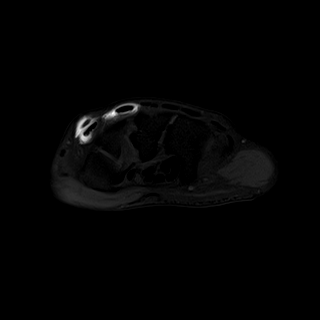
[im 17/17]
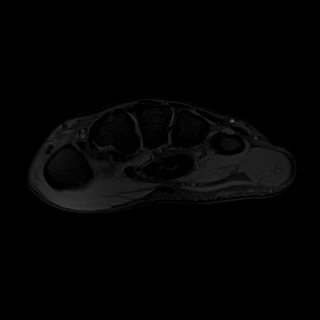

[Series 4: T2 fat-sat · axial · right · 3.0mm · 0.34mm/px · z∈[-51,-3]mm · 7 of 17 slices shown (1 of 2)]
[im 1/17]
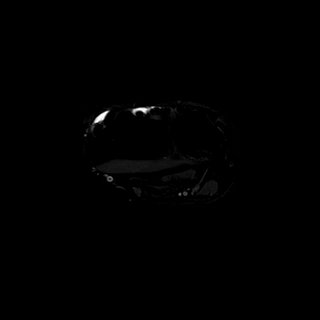
[im 3/17]
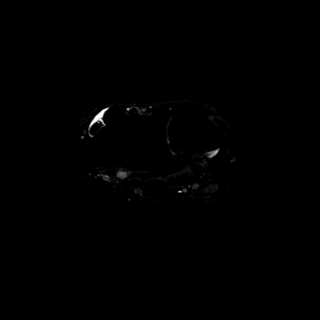
[im 6/17]
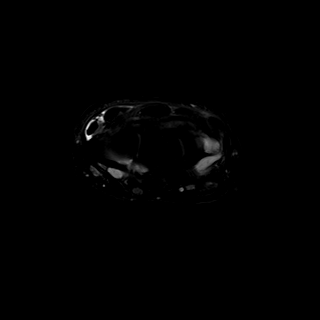
[im 9/17]
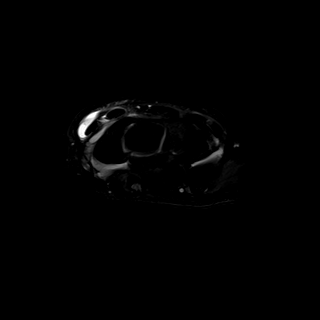
[im 11/17]
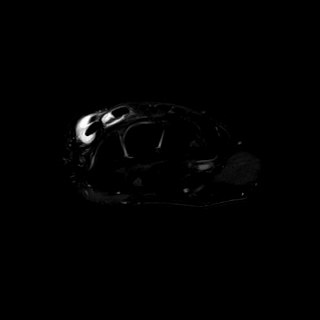
[im 14/17]
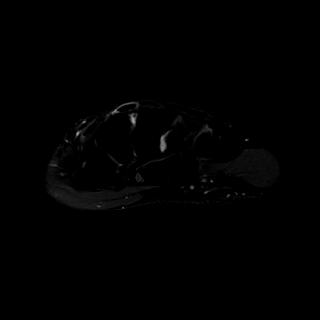
[im 17/17]
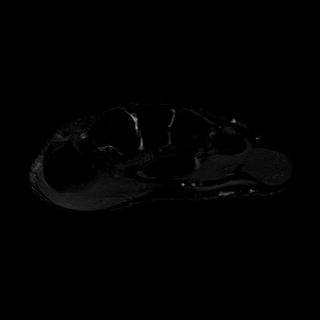

[Series 5: T1 fat-sat · coronal · right · 3.0mm · 0.34mm/px · 6 of 14 slices shown (2 of 3)]
[im 1/14]
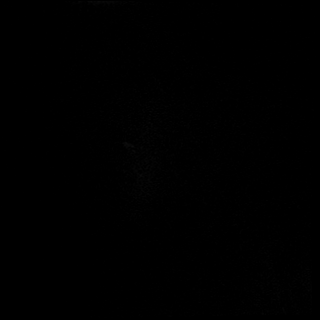
[im 3/14]
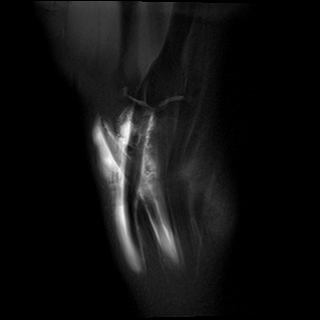
[im 6/14]
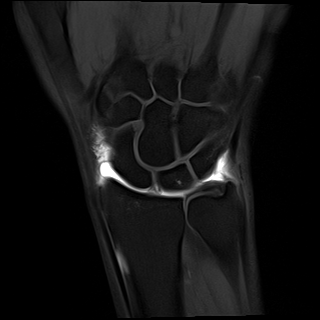
[im 8/14]
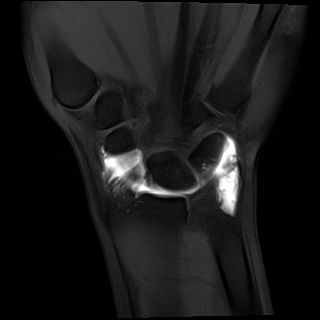
[im 11/14]
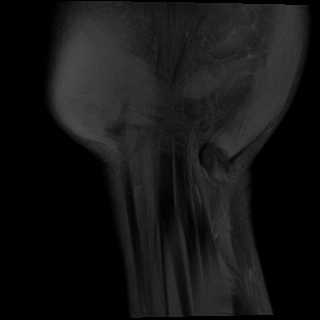
[im 14/14]
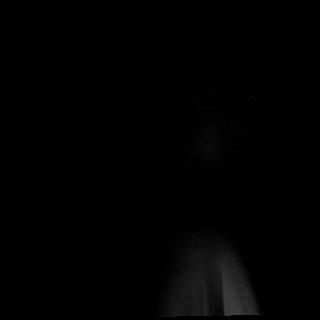

[Series 6: T1 · coronal · right · 3.0mm · 0.31mm/px · 6 of 14 slices shown]
[im 1/14]
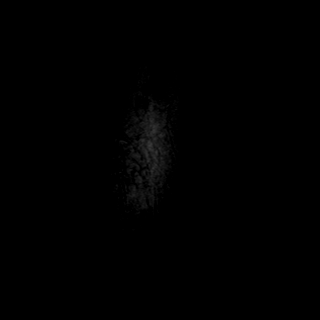
[im 3/14]
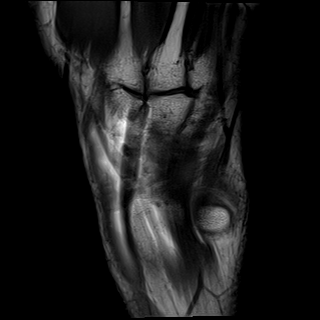
[im 6/14]
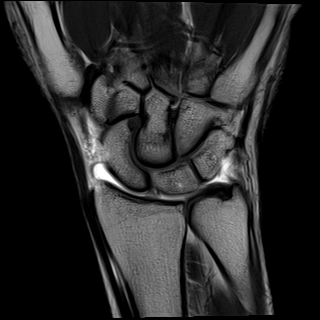
[im 8/14]
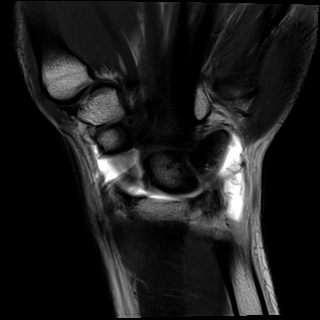
[im 11/14]
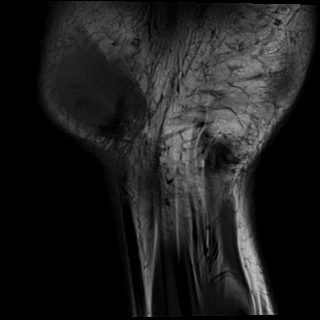
[im 14/14]
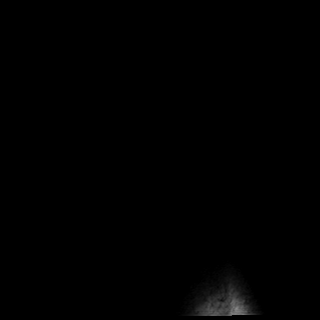

[Series 7: T2 fat-sat · coronal · right · 3.0mm · 0.31mm/px · 6 of 14 slices shown (2 of 2)]
[im 1/14]
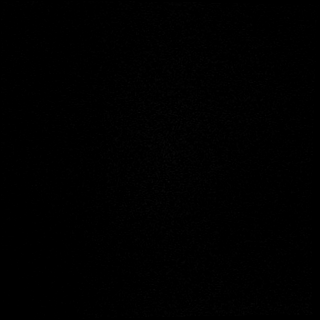
[im 3/14]
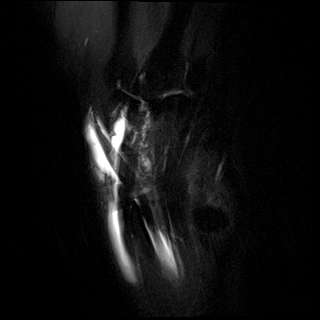
[im 6/14]
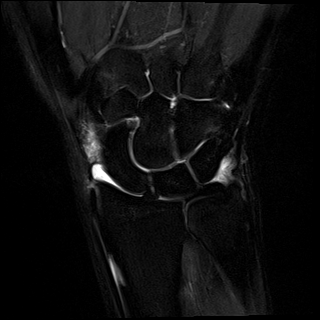
[im 8/14]
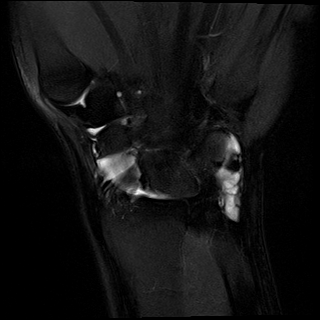
[im 11/14]
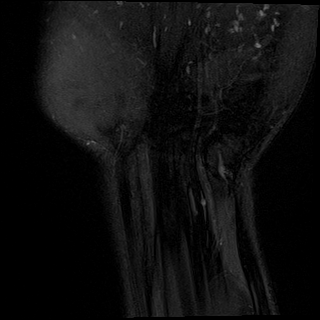
[im 14/14]
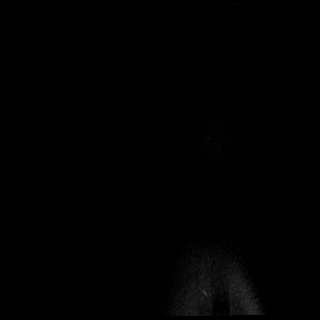

[Series 8: T1 fat-sat · sagittal · right · 3.0mm · 0.31mm/px · 9 of 23 slices shown (3 of 3)]
[im 1/23]
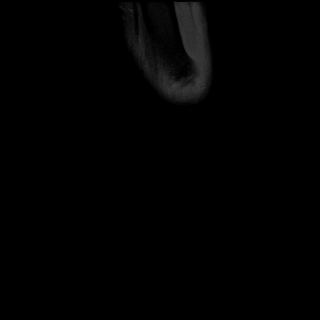
[im 3/23]
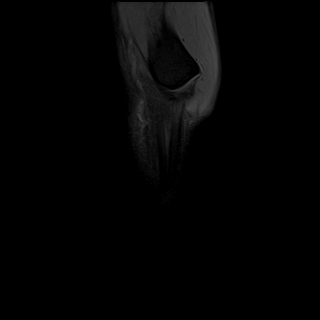
[im 6/23]
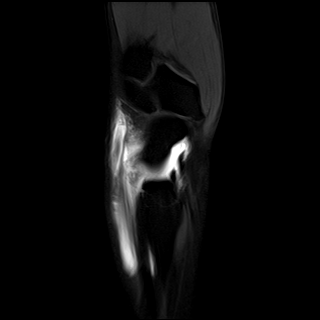
[im 9/23]
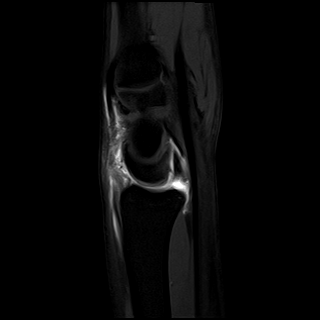
[im 12/23]
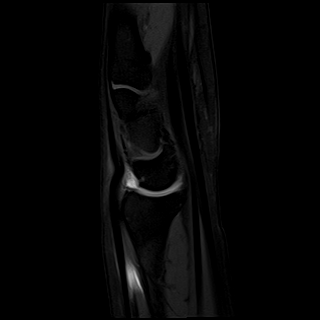
[im 14/23]
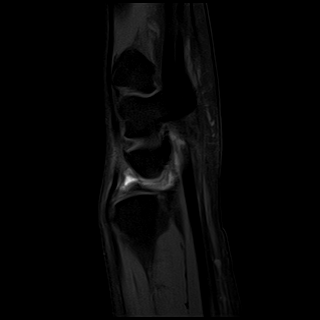
[im 17/23]
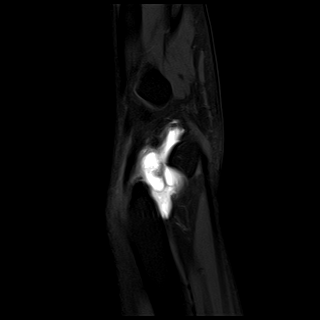
[im 20/23]
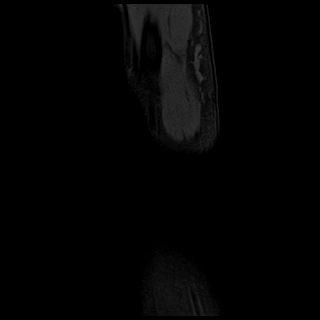
[im 23/23]
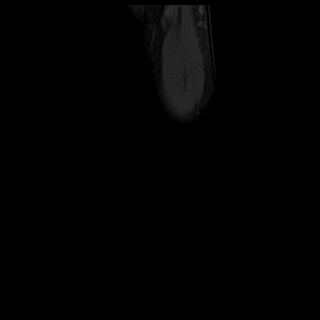

[40 of 40 positions shown; findings below may reference images not displayed]

FINDINGS: Ligaments: The intercarpal ligaments are intact. No extravasation of
contrast into the midcarpal joint space.

Triangular fibrocartilage: Intact. No extravasation of contrast into
the radioulnar joint space.

Tendons: The major dorsal and volar wrist tendons are intact. No
tendinopathy or tear is identified. There is extravasation of
contrast noted in the second and third dorsal compartments.

Carpal tunnel/median nerve: The carpal tunnel tendons appear normal.
No tendinopathy or tear. No mass or ganglion cyst. The median nerve
appears normal.

Guyon's canal: No mass or mass effect.

Joint/cartilage: The articular cartilage appears intact. No
cartilage defects or osteochondral lesion. No loose bodies are
identified.

Bones/carpal alignment: A few scattered small carpal cysts are noted
but no fracture, bone contusion or marrow edema.

Other: Normal appearance of the hand and wrist musculature.
IMPRESSION: 1. Intact intercarpal ligaments and TFCC.
2. Intact major dorsal and volar wrist tendons.
3. No acute bony findings or significant degenerative changes.

## 2018-10-06 MED ORDER — IOPAMIDOL (ISOVUE-M 200) INJECTION 41%
1.5000 mL | Freq: Once | INTRAMUSCULAR | Status: AC
  Administered 2018-10-06: 1.5 mL via INTRA_ARTICULAR

## 2019-02-07 ENCOUNTER — Emergency Department (HOSPITAL_COMMUNITY)
Admission: EM | Admit: 2019-02-07 | Discharge: 2019-02-08 | Disposition: A | Discharge status: Home / Self Care | Payer: Medicaid Other | Attending: Emergency Medicine | Admitting: Emergency Medicine

## 2019-02-07 ENCOUNTER — Encounter (HOSPITAL_COMMUNITY): Payer: Self-pay

## 2019-02-07 DIAGNOSIS — F17210 Nicotine dependence, cigarettes, uncomplicated: Secondary | ICD-10-CM | POA: Diagnosis not present

## 2019-02-07 DIAGNOSIS — Z79899 Other long term (current) drug therapy: Secondary | ICD-10-CM | POA: Insufficient documentation

## 2019-02-07 DIAGNOSIS — R1013 Epigastric pain: Secondary | ICD-10-CM | POA: Diagnosis present

## 2019-02-07 DIAGNOSIS — R519 Headache, unspecified: Secondary | ICD-10-CM | POA: Diagnosis not present

## 2019-02-07 LAB — CBC
HCT: 45.1 % (ref 36.0–46.0)
Hemoglobin: 13.5 g/dL (ref 12.0–15.0)
MCH: 23.4 pg — ABNORMAL LOW (ref 26.0–34.0)
MCHC: 29.9 g/dL — ABNORMAL LOW (ref 30.0–36.0)
MCV: 78.3 fL — ABNORMAL LOW (ref 80.0–100.0)
Platelets: 256 10*3/uL (ref 150–400)
RBC: 5.76 MIL/uL — ABNORMAL HIGH (ref 3.87–5.11)
RDW: 12.9 % (ref 11.5–15.5)
WBC: 3.7 10*3/uL — ABNORMAL LOW (ref 4.0–10.5)
nRBC: 0 % (ref 0.0–0.2)

## 2019-02-07 LAB — COMPREHENSIVE METABOLIC PANEL
ALT: 18 U/L (ref 0–44)
AST: 16 U/L (ref 15–41)
Albumin: 4.3 g/dL (ref 3.5–5.0)
Alkaline Phosphatase: 54 U/L (ref 38–126)
Anion gap: 12 (ref 5–15)
BUN: 9 mg/dL (ref 6–20)
CO2: 24 mmol/L (ref 22–32)
Calcium: 9.4 mg/dL (ref 8.9–10.3)
Chloride: 106 mmol/L (ref 98–111)
Creatinine, Ser: 0.91 mg/dL (ref 0.44–1.00)
GFR calc Af Amer: >60 mL/min (ref >60)
GFR calc non Af Amer: >60 mL/min (ref >60)
Glucose, Bld: 94 mg/dL (ref 70–99)
Potassium: 4.2 mmol/L (ref 3.5–5.1)
Sodium: 142 mmol/L (ref 135–145)
Total Bilirubin: 0.4 mg/dL (ref 0.3–1.2)
Total Protein: 7.1 g/dL (ref 6.5–8.1)

## 2019-02-07 LAB — URINALYSIS, ROUTINE W REFLEX MICROSCOPIC
Bilirubin Urine: NEGATIVE
Glucose, UA: NEGATIVE mg/dL
Hgb urine dipstick: NEGATIVE
Ketones, ur: 5 mg/dL — AB
Leukocytes,Ua: NEGATIVE
Nitrite: NEGATIVE
Protein, ur: NEGATIVE mg/dL
Specific Gravity, Urine: 1.032 — ABNORMAL HIGH (ref 1.005–1.030)
pH: 5 (ref 5.0–8.0)

## 2019-02-07 LAB — I-STAT BETA HCG BLOOD, ED (MC, WL, AP ONLY): I-stat hCG, quantitative: <5 m[IU]/mL (ref <5)

## 2019-02-07 LAB — LIPASE, BLOOD: Lipase: 19 U/L (ref 11–51)

## 2019-02-07 MED ORDER — SODIUM CHLORIDE 0.9% FLUSH
3.0000 mL | Freq: Once | INTRAVENOUS | Status: AC
  Administered 2019-02-08: 3 mL via INTRAVENOUS

## 2019-02-07 NOTE — ED Triage Notes (Signed)
Pt states that for the past week she has been having RUQ abd pain with n/v.

## 2019-02-07 NOTE — ED Notes (Signed)
Patient refusing to wear gown.

## 2019-02-07 NOTE — ED Notes (Signed)
Pt asking how much longer the wait is multiple times. Cursing at staff.

## 2019-02-08 MED ORDER — SUCRALFATE 1 G PO TABS
1.0000 g | ORAL_TABLET | Freq: Once | ORAL | Status: AC
  Administered 2019-02-08: 1 g via ORAL
  Filled 2019-02-08: qty 1

## 2019-02-08 MED ORDER — PROMETHAZINE HCL 25 MG PO TABS: 25.0000 mg | ORAL_TABLET | Freq: Four times a day (QID) | ORAL | 0 refills | Status: AC | PRN

## 2019-02-08 MED ORDER — SUCRALFATE 1 G PO TABS: 1.0000 g | ORAL_TABLET | Freq: Four times a day (QID) | ORAL | 0 refills | Status: AC

## 2019-02-08 MED ORDER — PROCHLORPERAZINE EDISYLATE 10 MG/2ML IJ SOLN
10.0000 mg | Freq: Once | INTRAMUSCULAR | Status: AC
  Administered 2019-02-08: 10 mg via INTRAVENOUS
  Filled 2019-02-08: qty 2

## 2019-02-08 MED ORDER — DIPHENHYDRAMINE HCL 50 MG/ML IJ SOLN
25.0000 mg | Freq: Once | INTRAMUSCULAR | Status: AC
  Administered 2019-02-08: 01:00:00 25 mg via INTRAVENOUS
  Filled 2019-02-08: qty 1

## 2019-02-08 MED ORDER — FAMOTIDINE IN NACL 20-0.9 MG/50ML-% IV SOLN
20.0000 mg | Freq: Once | INTRAVENOUS | Status: AC
  Administered 2019-02-08: 20 mg via INTRAVENOUS
  Filled 2019-02-08: qty 50

## 2019-02-08 MED ORDER — SODIUM CHLORIDE 0.9 % IV BOLUS
1000.0000 mL | Freq: Once | INTRAVENOUS | Status: AC
  Administered 2019-02-08: 1000 mL via INTRAVENOUS

## 2019-02-08 NOTE — ED Provider Notes (Signed)
[facility] EMERGENCY DEPARTMENT Provider Note   CSN: [id number] Arrival date & time: 02/07/19  1851     History Chief Complaint  Patient presents with  . Abdominal Pain    Lynn Rocha is a 29 y.o. female with a history of H. pylori, celiac disease, GERD who presents to the emergency department with a chief complaint of abdominal pain and headache.  The patient reports that she developed a sudden headache located to the bilateral frontal and parietal area of her head 5 days ago.  She reports that the onset of the headache was accompanied by epigastric abdominal pain.  She reports that both symptoms have been constant since onset 5 days ago.  She reports associated nausea and 2 episodes of vomiting.  Vomiting began yesterday.  She reports that following the episode of vomiting that she developed "purple spots" and blurred vision" for several minutes following the episode.  No other visual complaints.  No slurred speech, facial droop, numbness, weakness, chest pain, cough, or shortness of breath, leg pain, dysuria, hematuria, vaginal pain, or diarrhea.  She reports that she has felt constipated over the last few days, but her last bowel movement was earlier today.  No melena or hematochezia.  She is taking Tylenol for symptoms without improvement.  Last dose was yesterday.  She endorses a known history of allergies and asthma.  The history is provided by the patient. No language interpreter was used.       Past Medical History:  Diagnosis Date  . Acid reflux   . Depression     There are no problems to display for this patient.   History reviewed. No pertinent surgical history.   OB History    Gravida  4   Para  3   Term      Preterm      AB      Living  3     SAB      TAB      Ectopic      Multiple      Live Births              Family History  Problem Relation Age of Onset  . Asthma Sister     Social History   Tobacco Use  .  Smoking status: Current Some Day Smoker    Packs/day: 0.25  . Smokeless tobacco: Never Used  Substance Use Topics  . Alcohol use: Yes    Comment: occasionally  . Drug use: Never    Home Medications Prior to Admission medications   Medication Sig Start Date End Date Taking? Authorizing Provider  metroNIDAZOLE (METROGEL) 0.75 % gel Apply 1 application topically 2 (two) times daily. 04/20/18   Adam Phenix, MD  promethazine (PHENERGAN) 25 MG tablet Take 1 tablet (25 mg total) by mouth every 6 (six) hours as needed for nausea or vomiting. 02/08/19   Britanee Vanblarcom A, PA-C  sucralfate (CARAFATE) 1 g tablet Take 1 tablet (1 g total) by mouth 4 (four) times daily for 14 days. 02/08/19 02/22/19  Ferol Laiche A, PA-C    Allergies    Patient has no known allergies.  Review of Systems   Review of Systems  Constitutional: Negative for activity change, chills and fever.  HENT: Positive for congestion, postnasal drip, sinus pressure and sinus pain. Negative for drooling, ear discharge, ear pain, facial swelling, mouth sores, nosebleeds, sore throat and voice change.   Eyes: Positive for visual disturbance. Negative for  photophobia, pain and itching.  Respiratory: Negative for shortness of breath and wheezing.   Cardiovascular: Negative for chest pain, palpitations and leg swelling.  Gastrointestinal: Positive for abdominal pain, nausea and vomiting. Negative for anal bleeding, blood in stool, constipation and diarrhea.  Genitourinary: Negative for dysuria, flank pain, genital sores, urgency and vaginal bleeding.  Musculoskeletal: Negative for back pain.  Skin: Negative for rash.  Allergic/Immunologic: Negative for immunocompromised state.  Neurological: Positive for headaches. Negative for dizziness, syncope, weakness, light-headedness and numbness.  Psychiatric/Behavioral: Negative for confusion.    Physical Exam Updated Vital Signs BP 100/69   Pulse 63   Temp 98.4 F (36.9 C) (Oral)   Resp  16   LMP 01/23/2018   SpO2 100%   Physical Exam Vitals and nursing note reviewed.  Constitutional:      General: She is not in acute distress.    Appearance: She is not ill-appearing, toxic-appearing or diaphoretic.  HENT:     Head: Normocephalic.     Right Ear: Tympanic membrane and ear canal normal.     Left Ear: Tympanic membrane and ear canal normal.     Nose:     Right Nostril: No septal hematoma.     Left Nostril: No septal hematoma.     Right Turbinates: Enlarged.     Left Turbinates: Enlarged.     Right Sinus: Maxillary sinus tenderness and frontal sinus tenderness present.     Left Sinus: Maxillary sinus tenderness and frontal sinus tenderness present.     Mouth/Throat:     Mouth: Mucous membranes are moist.  Eyes:     Extraocular Movements: Extraocular movements intact.     Conjunctiva/sclera: Conjunctivae normal.     Pupils: Pupils are equal, round, and reactive to light.  Cardiovascular:     Rate and Rhythm: Normal rate and regular rhythm.     Heart sounds: No murmur. No friction rub. No gallop.   Pulmonary:     Effort: Pulmonary effort is normal. No respiratory distress.     Breath sounds: No stridor. No wheezing or rhonchi.  Chest:     Chest wall: No tenderness.  Abdominal:     General: There is no distension.     Palpations: Abdomen is soft. There is no mass.     Tenderness: There is abdominal tenderness. There is no right CVA tenderness, left CVA tenderness, guarding or rebound.     Hernia: No hernia is present.     Comments: Tender to palpation in the epigastric region without rebound or guarding.  Negative Murphy sign.  No CVA tenderness bilaterally.  Abdomen is soft and nondistended.  Normoactive bowel sounds.  Musculoskeletal:     Cervical back: Neck supple.  Skin:    General: Skin is warm.     Findings: No rash.  Neurological:     General: No focal deficit present.     Mental Status: She is alert.  Psychiatric:        Behavior: Behavior normal.      ED Results / Procedures / Treatments   Labs (all labs ordered are listed, but only abnormal results are displayed) Labs Reviewed  CBC - Abnormal; Notable for the following components:      Result Value   WBC 3.7 (*)    RBC 5.76 (*)    MCV 78.3 (*)    MCH 23.4 (*)    MCHC 29.9 (*)    All other components within normal limits  URINALYSIS, ROUTINE W REFLEX MICROSCOPIC -  Abnormal; Notable for the following components:   APPearance HAZY (*)    Specific Gravity, Urine 1.032 (*)    Ketones, ur 5 (*)    All other components within normal limits  LIPASE, BLOOD  COMPREHENSIVE METABOLIC PANEL  I-STAT BETA HCG BLOOD, ED ([facility], [facility], [facility] ONLY)    EKG None  Radiology No results found.  Procedures Procedures (including critical care time)  Medications Ordered in ED Medications  sodium chloride flush (NS) 0.9 % injection 3 mL (3 mLs Intravenous Given 02/08/19 0040)  sodium chloride 0.9 % bolus 1,000 mL (0 mLs Intravenous Stopped 02/08/19 0129)  prochlorperazine (COMPAZINE) injection 10 mg (10 mg Intravenous Given 02/08/19 0035)  diphenhydrAMINE (BENADRYL) injection 25 mg (25 mg Intravenous Given 02/08/19 0035)  sucralfate (CARAFATE) tablet 1 g (1 g Oral Given 02/08/19 0137)  famotidine (PEPCID) IVPB 20 mg premix (0 mg Intravenous Stopped 02/08/19 0243)    ED Course  I have reviewed the triage vital signs and the nursing notes.  Pertinent labs & imaging results that were available during my care of the patient were reviewed by me and considered in my medical decision making (see chart for details).    MDM Rules/Calculators/A&P                      29 year old female with a history of H. pylori, celiac disease, GERD presenting with headache and epigastric abdominal pain that began simultaneously 5 days ago.  She has had vomiting, but has tolerated fluids since her last episode of vomiting.  She is having some associated URI symptoms.  She does have multiple known chronic GI conditions,  including celiac disease.  She has previously been treated for H. pylori.  No history of peptic ulcer disease.  She is having no melena or hematochezia.  Labs are also normal.  Doubt peptic ulcer disease or ruptured peptic ulcer disease.  Doubt cholecystitis, choledocholithiasis, pancreatitis, or pyelonephritis.  She has no GU complaints.  Question abdominal migraine given symptom onset at the same time.  However, on her exam she does have tenderness over the bilateral frontal sinuses.  She does not tolerate nasal exam while at the bedside, but there is considerable occlusion of the bilateral naris concerning for very enlarged turbinates versus nasal polyps.  I think it would be reasonable to have her follow-up with ENT given this exam.  Her symptoms have only been ongoing for 5 days so doubt acute bacterial sinusitis.  Also since she has been having abdominal pain, treatment with Augmentin could make abdominal pain worse and she may also begin to have diarrhea.   She was given a migraine cocktail in the ER, and on reevaluation headache had resolved.  She was still having some epigastric abdominal pain, but after Pepcid and Carafate abdominal pain was markedly improved.  At this time, she is feeling much better and would like to be discharged.  She is not currently established with gastroenterology at this time as she just recently regained health insurance.  I will provide her with a follow-up given her chronic GI medical conditions.  All questions answered.  She is hemodynamically stable and in no acute distress.  Safe for discharge to home with outpatient follow-up as indicated.  Final Clinical Impression(s) / ED Diagnoses Final diagnoses:  Epigastric abdominal pain  Acute nonintractable headache, unspecified headache type    Rx / DC Orders ED Discharge Orders         Ordered    promethazine (PHENERGAN)  25 MG tablet  Every 6 hours PRN     02/08/19 0216    sucralfate (CARAFATE) 1 g tablet  4 times  daily     02/08/19 0216           Hara Milholland A, PA-C 02/08/19 [id number]    Zadie Rhine, MD 02/09/19 [phone]

## 2019-02-08 NOTE — Discharge Instructions (Addendum)
Thank you for allowing me to care for you today in the Emergency Department.   Call to schedule a follow-up appointment with gastroenterology for your abdominal pain.  You can take 1 tablet of Carafate every 6 hours as needed for the next 6 days.  You were given a dose tonight in the emergency department.  Pepcid is also available over-the-counter and can be used as directed on the label.  You were also given a dose of this tonight in the ER.  Please follow-up with ENT regarding the headaches that you have been having and to have a repeat exam of your nose.  You can take Tylenol your headache returns.  Return to the emergency department if you develop a severe headache with new numbness, weakness, slurred speech, facial droop, intractable vomiting despite taking Phenergan, severe abdominal pain without fever, black or bloody stools, or other new, concerning symptoms.

## 2019-04-18 ENCOUNTER — Other Ambulatory Visit: Payer: Self-pay | Admitting: Orthopedic Surgery

## 2019-04-18 ENCOUNTER — Other Ambulatory Visit (HOSPITAL_COMMUNITY): Payer: Self-pay | Admitting: Orthopedic Surgery

## 2019-04-18 DIAGNOSIS — M79644 Pain in right finger(s): Secondary | ICD-10-CM

## 2019-05-02 ENCOUNTER — Other Ambulatory Visit: Payer: Self-pay

## 2019-05-02 ENCOUNTER — Encounter (HOSPITAL_COMMUNITY)
Admission: RE | Admit: 2019-05-02 | Discharge: 2019-05-02 | Disposition: A | Discharge status: Home / Self Care | Payer: Medicaid Other | Source: Ambulatory Visit | Attending: Orthopedic Surgery | Admitting: Orthopedic Surgery

## 2019-05-02 DIAGNOSIS — M79644 Pain in right finger(s): Secondary | ICD-10-CM | POA: Insufficient documentation

## 2019-05-02 IMAGING — NM NM BONE 3 PHASE
8 series · 18 of 18 positions shown · non-contrast
Comparison: None

Correlation: RIGHT hand radiographs 06/26/2018, MR RIGHT wrist
10/06/2018

CLINICAL DATA: RIGHT thumb pain, unable to flatten hand, fell in
June 2018

EXAM:
NUCLEAR MEDICINE 3-PHASE BONE SCAN
TECHNIQUE: Radionuclide angiographic images, immediate static blood pool
images, and 3-hour delayed static images were obtained of the hands
after intravenous injection of radiopharmaceutical.
RADIOPHARMACEUTICALS:  21.6 mCi Pc-EEm MDP IV

[Series 1: flow · 4.14mm/px · 6 of 40 frames shown (1 of 2)]
[frame 4/40]
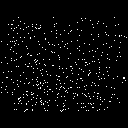
[frame 10/40]
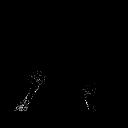
[frame 17/40]
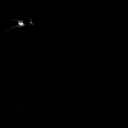
[frame 24/40]
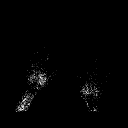
[frame 30/40]
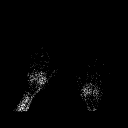
[frame 37/40]
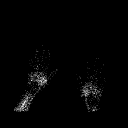

[Series 1: flow · 4.14mm/px · 6 of 40 frames shown (2 of 2)]
[frame 4/40]
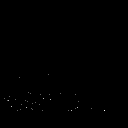
[frame 10/40]
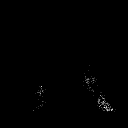
[frame 17/40]
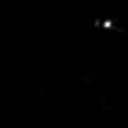
[frame 24/40]
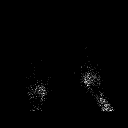
[frame 30/40]
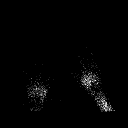
[frame 37/40]
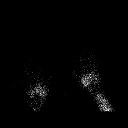

[Series 2: blood pool · 2.07mm/px · 1 of 1 slices shown (1 of 6)]
[im 1/1  full-range]
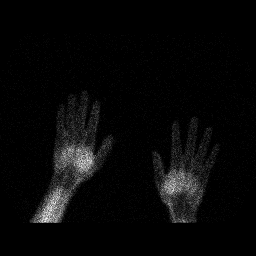

[Series 3: blood pool · 2.07mm/px · 1 of 1 slices shown (2 of 6)]
[im 1/1]
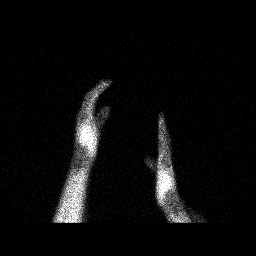

[Series 4: blood pool · 2.07mm/px · 1 of 1 slices shown (3 of 6)]
[im 1/1  full-range]
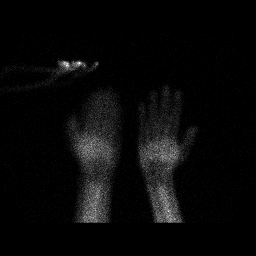

[Series 5: blood pool · 2.07mm/px · 1 of 1 slices shown (4 of 6)]
[im 1/1  full-range]
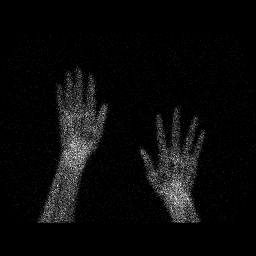

[Series 6: blood pool · 2.07mm/px · 1 of 1 slices shown (5 of 6)]
[im 1/1  full-range]
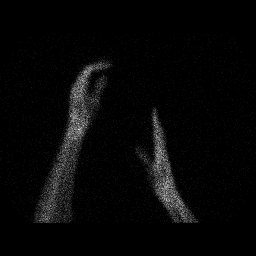

[Series 7: blood pool · 2.07mm/px · 1 of 1 slices shown (6 of 6)]
[im 1/1  full-range]
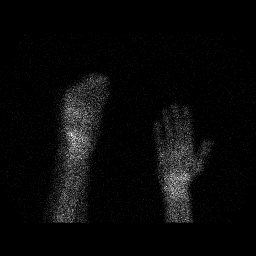

[18 of 18 positions shown; findings below may reference images not displayed]

FINDINGS: Vascular phase: Normal blood flow to both hands/wrists.

Blood pool phase: Normal blood pool at both hands/wrists.

Delayed phase: No definite sites of abnormal osseous tracer uptake
are identified. Specifically no abnormal tracer uptake seen at the
RIGHT thumb.
IMPRESSION: Normal exam.

## 2019-05-02 MED ORDER — TECHNETIUM TC 99M MEDRONATE IV KIT
21.6000 | PACK | Freq: Once | INTRAVENOUS | Status: AC
  Administered 2019-05-02: 21.6 via INTRAVENOUS
